# Patient Record
Sex: Female | Born: 1940 | Race: White | Hispanic: No | Marital: Married | Smoking: Never smoker
Health system: Southern US, Community
[De-identification: ages and names within clinical notes are randomized; demographics above are authoritative.]

## PROBLEM LIST (undated history)

## (undated) DIAGNOSIS — I10 Essential (primary) hypertension: Secondary | ICD-10-CM

## (undated) DIAGNOSIS — J45909 Unspecified asthma, uncomplicated: Secondary | ICD-10-CM

## (undated) DIAGNOSIS — E119 Type 2 diabetes mellitus without complications: Secondary | ICD-10-CM

## (undated) DIAGNOSIS — R42 Dizziness and giddiness: Secondary | ICD-10-CM

## (undated) HISTORY — PX: CHOLECYSTECTOMY: SHX55

---

## 1998-06-22 ENCOUNTER — Other Ambulatory Visit: Admission: RE | Admit: 1998-06-22 | Discharge: 1998-06-22 | Payer: Self-pay | Admitting: *Deleted

## 1998-08-13 ENCOUNTER — Other Ambulatory Visit: Admission: RE | Admit: 1998-08-13 | Discharge: 1998-08-13 | Payer: Self-pay | Admitting: *Deleted

## 1999-02-14 ENCOUNTER — Ambulatory Visit (HOSPITAL_COMMUNITY): Admission: RE | Admit: 1999-02-14 | Discharge: 1999-02-14 | Payer: Self-pay | Admitting: Gastroenterology

## 1999-02-14 ENCOUNTER — Encounter (INDEPENDENT_AMBULATORY_CARE_PROVIDER_SITE_OTHER): Payer: Self-pay

## 1999-09-07 ENCOUNTER — Other Ambulatory Visit: Admission: RE | Admit: 1999-09-07 | Discharge: 1999-09-07 | Payer: Self-pay | Admitting: *Deleted

## 2004-06-29 ENCOUNTER — Other Ambulatory Visit: Admission: RE | Admit: 2004-06-29 | Discharge: 2004-06-29 | Payer: Self-pay | Admitting: *Deleted

## 2005-10-23 ENCOUNTER — Other Ambulatory Visit: Admission: RE | Admit: 2005-10-23 | Discharge: 2005-10-23 | Payer: Self-pay | Admitting: *Deleted

## 2006-08-22 ENCOUNTER — Encounter: Admission: RE | Admit: 2006-08-22 | Discharge: 2006-08-22 | Payer: Self-pay | Admitting: *Deleted

## 2006-08-31 ENCOUNTER — Encounter: Admission: RE | Admit: 2006-08-31 | Discharge: 2006-08-31 | Payer: Self-pay | Admitting: General Surgery

## 2006-08-31 ENCOUNTER — Encounter (INDEPENDENT_AMBULATORY_CARE_PROVIDER_SITE_OTHER): Payer: Self-pay | Admitting: *Deleted

## 2006-08-31 ENCOUNTER — Ambulatory Visit (HOSPITAL_BASED_OUTPATIENT_CLINIC_OR_DEPARTMENT_OTHER): Admission: RE | Admit: 2006-08-31 | Discharge: 2006-08-31 | Payer: Self-pay | Admitting: General Surgery

## 2008-01-28 ENCOUNTER — Other Ambulatory Visit: Admission: RE | Admit: 2008-01-28 | Discharge: 2008-01-28 | Payer: Self-pay | Admitting: Gynecology

## 2010-12-02 NOTE — Op Note (Signed)
Emily Bryan, Emily Bryan            ACCOUNT NO.:  [id number]   MEDICAL RECORD NO.:  [id number]          PATIENT TYPE:  AMB   LOCATION:  DSC                          FACILITY:  [facility]   PHYSICIAN:  Rose Phi. Maple Hudson, M.D.   DATE OF BIRTH:  [birth year]   DATE OF PROCEDURE:  08/31/2006  DATE OF DISCHARGE:                               OPERATIVE REPORT   PREOPERATIVE DIAGNOSIS:  Left breast mass.   POSTOPERATIVE DIAGNOSIS:  Left breast mass.   OPERATION:  Excision of left breast mass with needle localization and  specimen mammogram.   SURGEON:  Rose Phi. Maple Hudson, M.D.   ANESTHESIA:  MAC.   OPERATIVE PROCEDURE:  Prior to coming to the operating room, a  localizing wire had been placed in the lesion at about the 5:00-5:30  position of the left breast.   The patient was placed on the operating table, with arms extended on the  arm board.  The left breast was prepped and draped in the usual fashion.  A transverse incision was then outlined over the previously marked area  and incorporating with the wire placement site.  The area was thoroughly  infiltrated with a local anesthetic mixture.  The incision was made the  wire delivered into the incision, and then of the wire and surrounding  tissue were excised.  One could tell that the lesion was excised, and  that was confirmed by specimen mammogram.   Hemostasis was obtained with the cautery.  A subcuticular closure using  a 3-0 Quill suture was carried out.  Dermabond was applied.  A light  dressing was then applied and the patient transferred to the recovery  room in satisfactory condition, having the tolerated procedure well.      Rose Phi. Maple Hudson, M.D.  Electronically Signed     PRY/MEDQ  D:  08/31/2006  T:  08/31/2006  Job:  [id number]

## 2013-05-30 ENCOUNTER — Other Ambulatory Visit (HOSPITAL_COMMUNITY): Payer: Self-pay | Admitting: Internal Medicine

## 2013-05-30 DIAGNOSIS — R079 Chest pain, unspecified: Secondary | ICD-10-CM

## 2013-06-09 ENCOUNTER — Telehealth: Payer: Self-pay | Admitting: *Deleted

## 2013-06-09 ENCOUNTER — Ambulatory Visit (HOSPITAL_COMMUNITY)
Admission: RE | Admit: 2013-06-09 | Discharge: 2013-06-09 | Disposition: A | Discharge status: Home / Self Care | Payer: Medicare Other | Source: Ambulatory Visit | Attending: Internal Medicine | Admitting: Internal Medicine

## 2013-06-09 DIAGNOSIS — R9439 Abnormal result of other cardiovascular function study: Secondary | ICD-10-CM | POA: Insufficient documentation

## 2013-06-09 DIAGNOSIS — I10 Essential (primary) hypertension: Secondary | ICD-10-CM

## 2013-06-09 DIAGNOSIS — R079 Chest pain, unspecified: Secondary | ICD-10-CM

## 2013-06-09 DIAGNOSIS — R0789 Other chest pain: Secondary | ICD-10-CM | POA: Insufficient documentation

## 2013-06-09 NOTE — Telephone Encounter (Signed)
Dr. Patty Sermons spoke with Dr Chilton Si earlier today. Patient had ETT at the hospital and after 3 and 1/2 minutes on Bruce protocol developed shortness of breath. Borderline ST in recovery. History of elevated blood pressure and exertional chest pain.  Dr. Patty Sermons recommended lexiscan myoview. Spoke with patient and went over instructions. Will mail her copy as well. Sent to [facility] for scheduling.

## 2013-06-10 NOTE — Telephone Encounter (Signed)
Patient has been scheduled for 06/30/13

## 2013-06-30 ENCOUNTER — Encounter: Payer: Self-pay | Admitting: Cardiovascular Disease

## 2013-06-30 ENCOUNTER — Ambulatory Visit (HOSPITAL_COMMUNITY): Payer: Medicare Other | Attending: Cardiovascular Disease | Admitting: Radiology

## 2013-06-30 ENCOUNTER — Encounter (INDEPENDENT_AMBULATORY_CARE_PROVIDER_SITE_OTHER): Payer: Self-pay

## 2013-06-30 VITALS — BP 162/85 | Ht 63.5 in | Wt 155.0 lb

## 2013-06-30 DIAGNOSIS — R0602 Shortness of breath: Secondary | ICD-10-CM

## 2013-06-30 DIAGNOSIS — I10 Essential (primary) hypertension: Secondary | ICD-10-CM | POA: Insufficient documentation

## 2013-06-30 DIAGNOSIS — R079 Chest pain, unspecified: Secondary | ICD-10-CM

## 2013-06-30 MED ORDER — TECHNETIUM TC 99M SESTAMIBI GENERIC - CARDIOLITE
33.0000 | Freq: Once | INTRAVENOUS | Status: AC | PRN
  Administered 2013-06-30: 33 via INTRAVENOUS

## 2013-06-30 MED ORDER — TECHNETIUM TC 99M SESTAMIBI GENERIC - CARDIOLITE
11.0000 | Freq: Once | INTRAVENOUS | Status: AC | PRN
  Administered 2013-06-30: 11 via INTRAVENOUS

## 2013-06-30 MED ORDER — REGADENOSON 0.4 MG/5ML IV SOLN
0.4000 mg | Freq: Once | INTRAVENOUS | Status: AC
  Administered 2013-06-30: 0.4 mg via INTRAVENOUS

## 2013-06-30 NOTE — Progress Notes (Signed)
[facility] SITE 3 NUCLEAR MED [address] [place], [place] [address] [phone]    Cardiology Nuclear Med Study  Emily Bryan is a 72 y.o. female     MRN : [id number]     DOB: 10/30/1940  Procedure Date: 06/30/2013  Nuclear Med Background Indication for Stress Test:  Evaluation for Ischemia and Abnormal GXT on 06/09/13 History:  No Known CAD, Asthma Cardiac Risk Factors: Family History - CAD and Hypertension  Symptoms:  Chest Pain, DOE and SOB   Nuclear Pre-Procedure Caffeine/Decaff Intake:  None > 12 hours NPO After: 9:00pm   Lungs:  clear O2 Sat: 98% on room air. IV 0.9% NS with Angio Cath:  22g  IV Site: R Antecubital  IV Started by:  Dario Guardian, CNMT  Chest Size (in):  36 Cup Size: C  Height: 5' 3.5" (1.613 m)  Weight:  155 lb (70.308 kg)  BMI:  Body mass index is 27.02 kg/(m^2). Tech Comments:  n/a    Nuclear Med Study 1 or 2 day study: 1 day  Stress Test Type:  Lexiscan  Reading MD: Willa Rough, MD  Order Authorizing Provider:  Villa Herb  Resting Radionuclide: Technetium 9m Sestamibi  Resting Radionuclide Dose: 11.0 mCi   Stress Radionuclide:  Technetium 82m Sestamibi  Stress Radionuclide Dose: 33.0 mCi           Stress Protocol Rest HR: 50 Stress HR: 84  Rest BP: 162/85 Stress BP: 188/82  Exercise Time (min): n/a METS: n/a   Predicted Max HR: 147 bpm % Max HR: 57.14 bpm Rate Pressure Product: 21308   Dose of Adenosine (mg):  n/a Dose of Lexiscan: 0.4 mg  Dose of Atropine (mg): n/a Dose of Dobutamine: n/a mcg/kg/min (at max HR)  Stress Test Technologist: Milana Na, EMT-P  Nuclear Technologist:  Domenic Polite, CNMT     Rest Procedure:  Myocardial perfusion imaging was performed at rest 45 minutes following the intravenous administration of Technetium 65m Sestamibi. Rest ECG: NSR - Normal EKG  Stress Procedure:  The patient received IV Lexiscan 0.4 mg over 15-seconds.  Technetium 70m Sestamibi injected at  30-seconds. This patient had sob, lt. Headed, and cough with the Lexiscan injection. Quantitative spect images were obtained after a 45 minute delay. Stress ECG: No significant change from baseline ECG  QPS Raw Data Images:  Normal; no motion artifact; normal heart/lung ratio. Stress Images:  Normal homogeneous uptake in all areas of the myocardium. Rest Images:  Normal homogeneous uptake in all areas of the myocardium. Subtraction (SDS):  No evidence of ischemia. Transient Ischemic Dilatation (Normal <1.22):  1.00 Lung/Heart Ratio (Normal <0.45):  0.30  Quantitative Gated Spect Images QGS EDV:  55 ml QGS ESV:  12 ml  Impression Exercise Capacity:  Lexiscan with no exercise. BP Response:  Normal blood pressure response. Clinical Symptoms:  No significant symptoms noted. ECG Impression:  No significant ST segment change suggestive of ischemia. Comparison with Prior Nuclear Study: No previous nuclear study performed  Overall Impression:  Normal stress nuclear study.  LV Ejection Fraction: 78%.  LV Wall Motion:  NL LV Function; NL Wall Motion.   Vesta Mixer, Montez Hageman., MD, [facility] 06/30/2013, 4:38 PM Office - [phone] Pager [phone]

## 2013-07-02 ENCOUNTER — Telehealth: Payer: Self-pay | Admitting: *Deleted

## 2013-07-02 NOTE — Telephone Encounter (Signed)
Message copied by Burnell Blanks on Wed Jul 02, 2013  9:35 AM ------      Message from: Cassell Clement      Created: Tue Jul 01, 2013  6:44 PM       Please report to patient and to Dr. Elmore Guise.  The lexiscan myoview was normal. No ischemia. Normal EF 78%. ------

## 2013-07-02 NOTE — Telephone Encounter (Signed)
Advised patient

## 2014-08-24 DIAGNOSIS — R799 Abnormal finding of blood chemistry, unspecified: Secondary | ICD-10-CM | POA: Diagnosis not present

## 2014-08-24 DIAGNOSIS — E118 Type 2 diabetes mellitus with unspecified complications: Secondary | ICD-10-CM | POA: Diagnosis not present

## 2014-08-24 DIAGNOSIS — I10 Essential (primary) hypertension: Secondary | ICD-10-CM | POA: Diagnosis not present

## 2014-10-19 DIAGNOSIS — E118 Type 2 diabetes mellitus with unspecified complications: Secondary | ICD-10-CM | POA: Diagnosis not present

## 2014-10-19 DIAGNOSIS — I10 Essential (primary) hypertension: Secondary | ICD-10-CM | POA: Diagnosis not present

## 2014-11-02 DIAGNOSIS — I10 Essential (primary) hypertension: Secondary | ICD-10-CM | POA: Diagnosis not present

## 2014-11-02 DIAGNOSIS — J45909 Unspecified asthma, uncomplicated: Secondary | ICD-10-CM | POA: Diagnosis not present

## 2015-01-04 DIAGNOSIS — I10 Essential (primary) hypertension: Secondary | ICD-10-CM | POA: Diagnosis not present

## 2015-01-04 DIAGNOSIS — E118 Type 2 diabetes mellitus with unspecified complications: Secondary | ICD-10-CM | POA: Diagnosis not present

## 2015-01-04 DIAGNOSIS — E119 Type 2 diabetes mellitus without complications: Secondary | ICD-10-CM | POA: Diagnosis not present

## 2015-01-25 DIAGNOSIS — J45909 Unspecified asthma, uncomplicated: Secondary | ICD-10-CM | POA: Diagnosis not present

## 2015-03-29 DIAGNOSIS — J45909 Unspecified asthma, uncomplicated: Secondary | ICD-10-CM | POA: Diagnosis not present

## 2015-03-29 DIAGNOSIS — Z23 Encounter for immunization: Secondary | ICD-10-CM | POA: Diagnosis not present

## 2015-05-26 DIAGNOSIS — D559 Anemia due to enzyme disorder, unspecified: Secondary | ICD-10-CM | POA: Diagnosis not present

## 2015-05-26 DIAGNOSIS — I6523 Occlusion and stenosis of bilateral carotid arteries: Secondary | ICD-10-CM | POA: Diagnosis not present

## 2015-05-26 DIAGNOSIS — E119 Type 2 diabetes mellitus without complications: Secondary | ICD-10-CM | POA: Diagnosis not present

## 2015-05-26 DIAGNOSIS — I6521 Occlusion and stenosis of right carotid artery: Secondary | ICD-10-CM | POA: Diagnosis not present

## 2015-05-26 DIAGNOSIS — I10 Essential (primary) hypertension: Secondary | ICD-10-CM | POA: Diagnosis not present

## 2015-05-26 DIAGNOSIS — Z0000 Encounter for general adult medical examination without abnormal findings: Secondary | ICD-10-CM | POA: Diagnosis not present

## 2015-05-26 DIAGNOSIS — J45909 Unspecified asthma, uncomplicated: Secondary | ICD-10-CM | POA: Diagnosis not present

## 2015-05-26 DIAGNOSIS — E109 Type 1 diabetes mellitus without complications: Secondary | ICD-10-CM | POA: Diagnosis not present

## 2015-06-24 DIAGNOSIS — Z1231 Encounter for screening mammogram for malignant neoplasm of breast: Secondary | ICD-10-CM | POA: Diagnosis not present

## 2015-07-17 DIAGNOSIS — J441 Chronic obstructive pulmonary disease with (acute) exacerbation: Secondary | ICD-10-CM | POA: Diagnosis not present

## 2015-07-17 DIAGNOSIS — J019 Acute sinusitis, unspecified: Secondary | ICD-10-CM | POA: Diagnosis not present

## 2015-08-30 DIAGNOSIS — J45998 Other asthma: Secondary | ICD-10-CM | POA: Diagnosis not present

## 2015-08-30 DIAGNOSIS — I10 Essential (primary) hypertension: Secondary | ICD-10-CM | POA: Diagnosis not present

## 2015-08-30 DIAGNOSIS — E1165 Type 2 diabetes mellitus with hyperglycemia: Secondary | ICD-10-CM | POA: Diagnosis not present

## 2015-09-06 ENCOUNTER — Other Ambulatory Visit: Payer: Self-pay | Admitting: Internal Medicine

## 2015-09-06 ENCOUNTER — Ambulatory Visit
Admission: RE | Admit: 2015-09-06 | Discharge: 2015-09-06 | Disposition: A | Discharge status: Home / Self Care | Payer: Medicare Other | Source: Ambulatory Visit | Attending: Internal Medicine | Admitting: Internal Medicine

## 2015-09-06 DIAGNOSIS — R05 Cough: Secondary | ICD-10-CM

## 2015-09-06 DIAGNOSIS — J45998 Other asthma: Secondary | ICD-10-CM | POA: Diagnosis not present

## 2015-09-06 DIAGNOSIS — J45909 Unspecified asthma, uncomplicated: Secondary | ICD-10-CM

## 2015-09-06 DIAGNOSIS — R059 Cough, unspecified: Secondary | ICD-10-CM

## 2015-09-27 DIAGNOSIS — J45998 Other asthma: Secondary | ICD-10-CM | POA: Diagnosis not present

## 2015-10-07 DIAGNOSIS — H2513 Age-related nuclear cataract, bilateral: Secondary | ICD-10-CM | POA: Diagnosis not present

## 2015-10-07 DIAGNOSIS — E119 Type 2 diabetes mellitus without complications: Secondary | ICD-10-CM | POA: Diagnosis not present

## 2015-10-09 DIAGNOSIS — R739 Hyperglycemia, unspecified: Secondary | ICD-10-CM | POA: Diagnosis not present

## 2015-10-09 DIAGNOSIS — R51 Headache: Secondary | ICD-10-CM | POA: Diagnosis not present

## 2015-10-09 DIAGNOSIS — Z7984 Long term (current) use of oral hypoglycemic drugs: Secondary | ICD-10-CM | POA: Diagnosis not present

## 2015-10-09 DIAGNOSIS — R350 Frequency of micturition: Secondary | ICD-10-CM | POA: Diagnosis not present

## 2015-10-09 DIAGNOSIS — E1165 Type 2 diabetes mellitus with hyperglycemia: Secondary | ICD-10-CM | POA: Diagnosis not present

## 2015-10-09 DIAGNOSIS — T380X5A Adverse effect of glucocorticoids and synthetic analogues, initial encounter: Secondary | ICD-10-CM | POA: Diagnosis not present

## 2015-10-09 DIAGNOSIS — R609 Edema, unspecified: Secondary | ICD-10-CM | POA: Diagnosis not present

## 2015-10-09 DIAGNOSIS — R0981 Nasal congestion: Secondary | ICD-10-CM | POA: Diagnosis not present

## 2015-10-09 DIAGNOSIS — R7989 Other specified abnormal findings of blood chemistry: Secondary | ICD-10-CM | POA: Diagnosis not present

## 2015-10-09 DIAGNOSIS — T887XXA Unspecified adverse effect of drug or medicament, initial encounter: Secondary | ICD-10-CM | POA: Diagnosis not present

## 2015-10-09 DIAGNOSIS — R748 Abnormal levels of other serum enzymes: Secondary | ICD-10-CM | POA: Diagnosis not present

## 2015-10-09 DIAGNOSIS — J45909 Unspecified asthma, uncomplicated: Secondary | ICD-10-CM | POA: Diagnosis not present

## 2015-10-18 DIAGNOSIS — E1165 Type 2 diabetes mellitus with hyperglycemia: Secondary | ICD-10-CM | POA: Diagnosis not present

## 2015-10-18 DIAGNOSIS — J45998 Other asthma: Secondary | ICD-10-CM | POA: Diagnosis not present

## 2015-11-01 DIAGNOSIS — E1165 Type 2 diabetes mellitus with hyperglycemia: Secondary | ICD-10-CM | POA: Diagnosis not present

## 2015-11-01 DIAGNOSIS — I119 Hypertensive heart disease without heart failure: Secondary | ICD-10-CM | POA: Diagnosis not present

## 2015-11-01 DIAGNOSIS — T814XXA Infection following a procedure, initial encounter: Secondary | ICD-10-CM | POA: Diagnosis not present

## 2015-11-01 DIAGNOSIS — L03317 Cellulitis of buttock: Secondary | ICD-10-CM | POA: Diagnosis not present

## 2015-11-04 DIAGNOSIS — B9562 Methicillin resistant Staphylococcus aureus infection as the cause of diseases classified elsewhere: Secondary | ICD-10-CM | POA: Diagnosis not present

## 2015-11-12 DIAGNOSIS — L0291 Cutaneous abscess, unspecified: Secondary | ICD-10-CM | POA: Diagnosis not present

## 2015-12-06 DIAGNOSIS — R609 Edema, unspecified: Secondary | ICD-10-CM | POA: Diagnosis not present

## 2015-12-06 DIAGNOSIS — A4902 Methicillin resistant Staphylococcus aureus infection, unspecified site: Secondary | ICD-10-CM | POA: Diagnosis not present

## 2015-12-06 DIAGNOSIS — E1165 Type 2 diabetes mellitus with hyperglycemia: Secondary | ICD-10-CM | POA: Diagnosis not present

## 2015-12-16 DIAGNOSIS — M7989 Other specified soft tissue disorders: Secondary | ICD-10-CM | POA: Diagnosis not present

## 2016-01-03 DIAGNOSIS — E1165 Type 2 diabetes mellitus with hyperglycemia: Secondary | ICD-10-CM | POA: Diagnosis not present

## 2016-01-03 DIAGNOSIS — E109 Type 1 diabetes mellitus without complications: Secondary | ICD-10-CM | POA: Diagnosis not present

## 2016-01-03 DIAGNOSIS — I119 Hypertensive heart disease without heart failure: Secondary | ICD-10-CM | POA: Diagnosis not present

## 2016-02-15 DIAGNOSIS — E1165 Type 2 diabetes mellitus with hyperglycemia: Secondary | ICD-10-CM | POA: Diagnosis not present

## 2016-02-15 DIAGNOSIS — J45909 Unspecified asthma, uncomplicated: Secondary | ICD-10-CM | POA: Diagnosis not present

## 2016-03-09 DIAGNOSIS — E1165 Type 2 diabetes mellitus with hyperglycemia: Secondary | ICD-10-CM | POA: Diagnosis not present

## 2016-03-09 DIAGNOSIS — J45909 Unspecified asthma, uncomplicated: Secondary | ICD-10-CM | POA: Diagnosis not present

## 2016-03-09 DIAGNOSIS — I10 Essential (primary) hypertension: Secondary | ICD-10-CM | POA: Diagnosis not present

## 2016-03-14 DIAGNOSIS — J45909 Unspecified asthma, uncomplicated: Secondary | ICD-10-CM | POA: Diagnosis not present

## 2016-03-28 DIAGNOSIS — Z23 Encounter for immunization: Secondary | ICD-10-CM | POA: Diagnosis not present

## 2016-03-28 DIAGNOSIS — J45909 Unspecified asthma, uncomplicated: Secondary | ICD-10-CM | POA: Diagnosis not present

## 2016-03-31 DIAGNOSIS — R6889 Other general symptoms and signs: Secondary | ICD-10-CM | POA: Diagnosis not present

## 2016-04-14 DIAGNOSIS — J45901 Unspecified asthma with (acute) exacerbation: Secondary | ICD-10-CM | POA: Diagnosis not present

## 2016-05-04 DIAGNOSIS — H2513 Age-related nuclear cataract, bilateral: Secondary | ICD-10-CM | POA: Diagnosis not present

## 2016-05-04 DIAGNOSIS — H538 Other visual disturbances: Secondary | ICD-10-CM | POA: Diagnosis not present

## 2016-05-14 DIAGNOSIS — J45901 Unspecified asthma with (acute) exacerbation: Secondary | ICD-10-CM | POA: Diagnosis not present

## 2016-05-23 DIAGNOSIS — J324 Chronic pansinusitis: Secondary | ICD-10-CM | POA: Diagnosis not present

## 2016-05-23 DIAGNOSIS — K219 Gastro-esophageal reflux disease without esophagitis: Secondary | ICD-10-CM | POA: Diagnosis not present

## 2016-05-23 DIAGNOSIS — J45909 Unspecified asthma, uncomplicated: Secondary | ICD-10-CM | POA: Diagnosis not present

## 2016-05-23 DIAGNOSIS — J310 Chronic rhinitis: Secondary | ICD-10-CM | POA: Diagnosis not present

## 2016-06-14 DIAGNOSIS — J45901 Unspecified asthma with (acute) exacerbation: Secondary | ICD-10-CM | POA: Diagnosis not present

## 2016-06-23 DIAGNOSIS — J324 Chronic pansinusitis: Secondary | ICD-10-CM | POA: Diagnosis not present

## 2016-06-23 DIAGNOSIS — J45909 Unspecified asthma, uncomplicated: Secondary | ICD-10-CM | POA: Diagnosis not present

## 2016-06-23 DIAGNOSIS — J0100 Acute maxillary sinusitis, unspecified: Secondary | ICD-10-CM | POA: Diagnosis not present

## 2016-06-23 DIAGNOSIS — J310 Chronic rhinitis: Secondary | ICD-10-CM | POA: Diagnosis not present

## 2016-07-14 DIAGNOSIS — J45901 Unspecified asthma with (acute) exacerbation: Secondary | ICD-10-CM | POA: Diagnosis not present

## 2017-07-25 IMAGING — CR DG CHEST 2V
2 series · 2 of 2 positions shown · non-contrast
Comparison: PA and lateral chest x-ray March 28, 2014

CLINICAL DATA: Two years of intermittent cough with low-grade fever
and weight loss

EXAM:
CHEST  2 VIEW

[w chest pa]
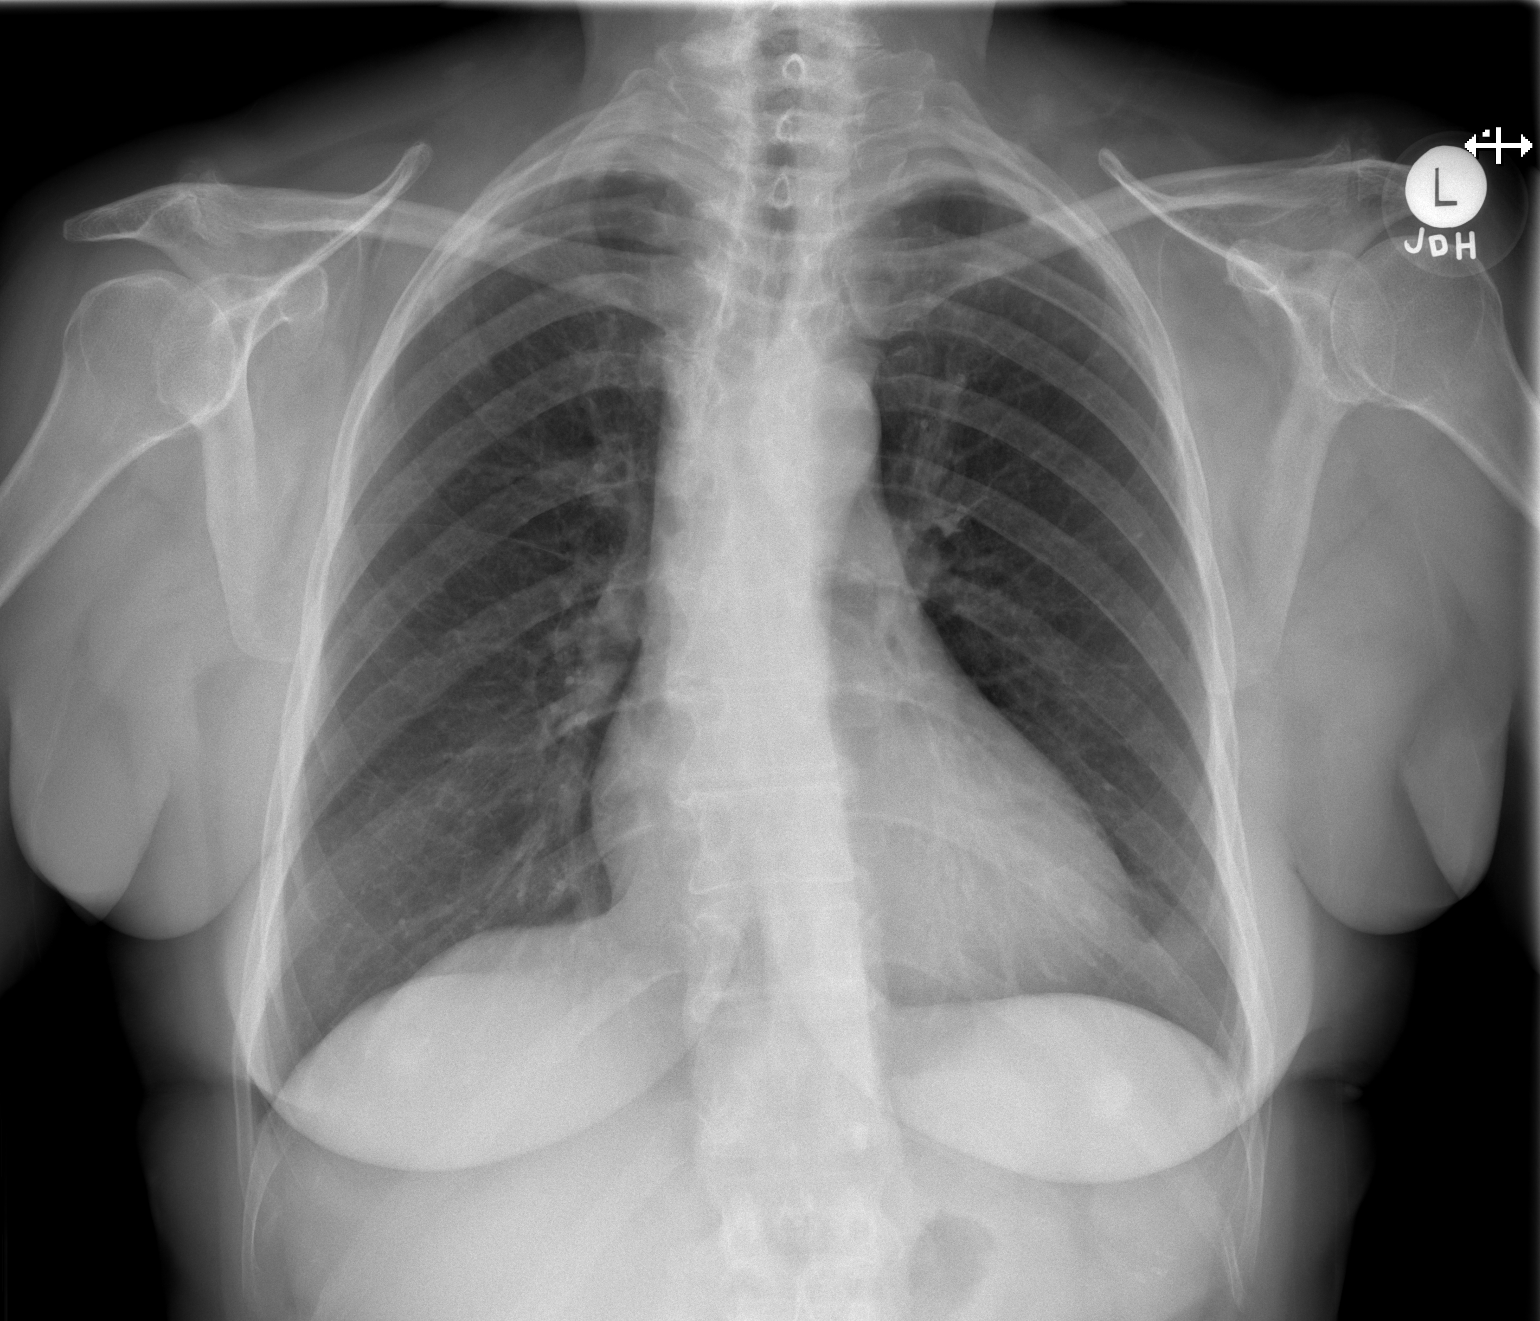

[w chest lat]
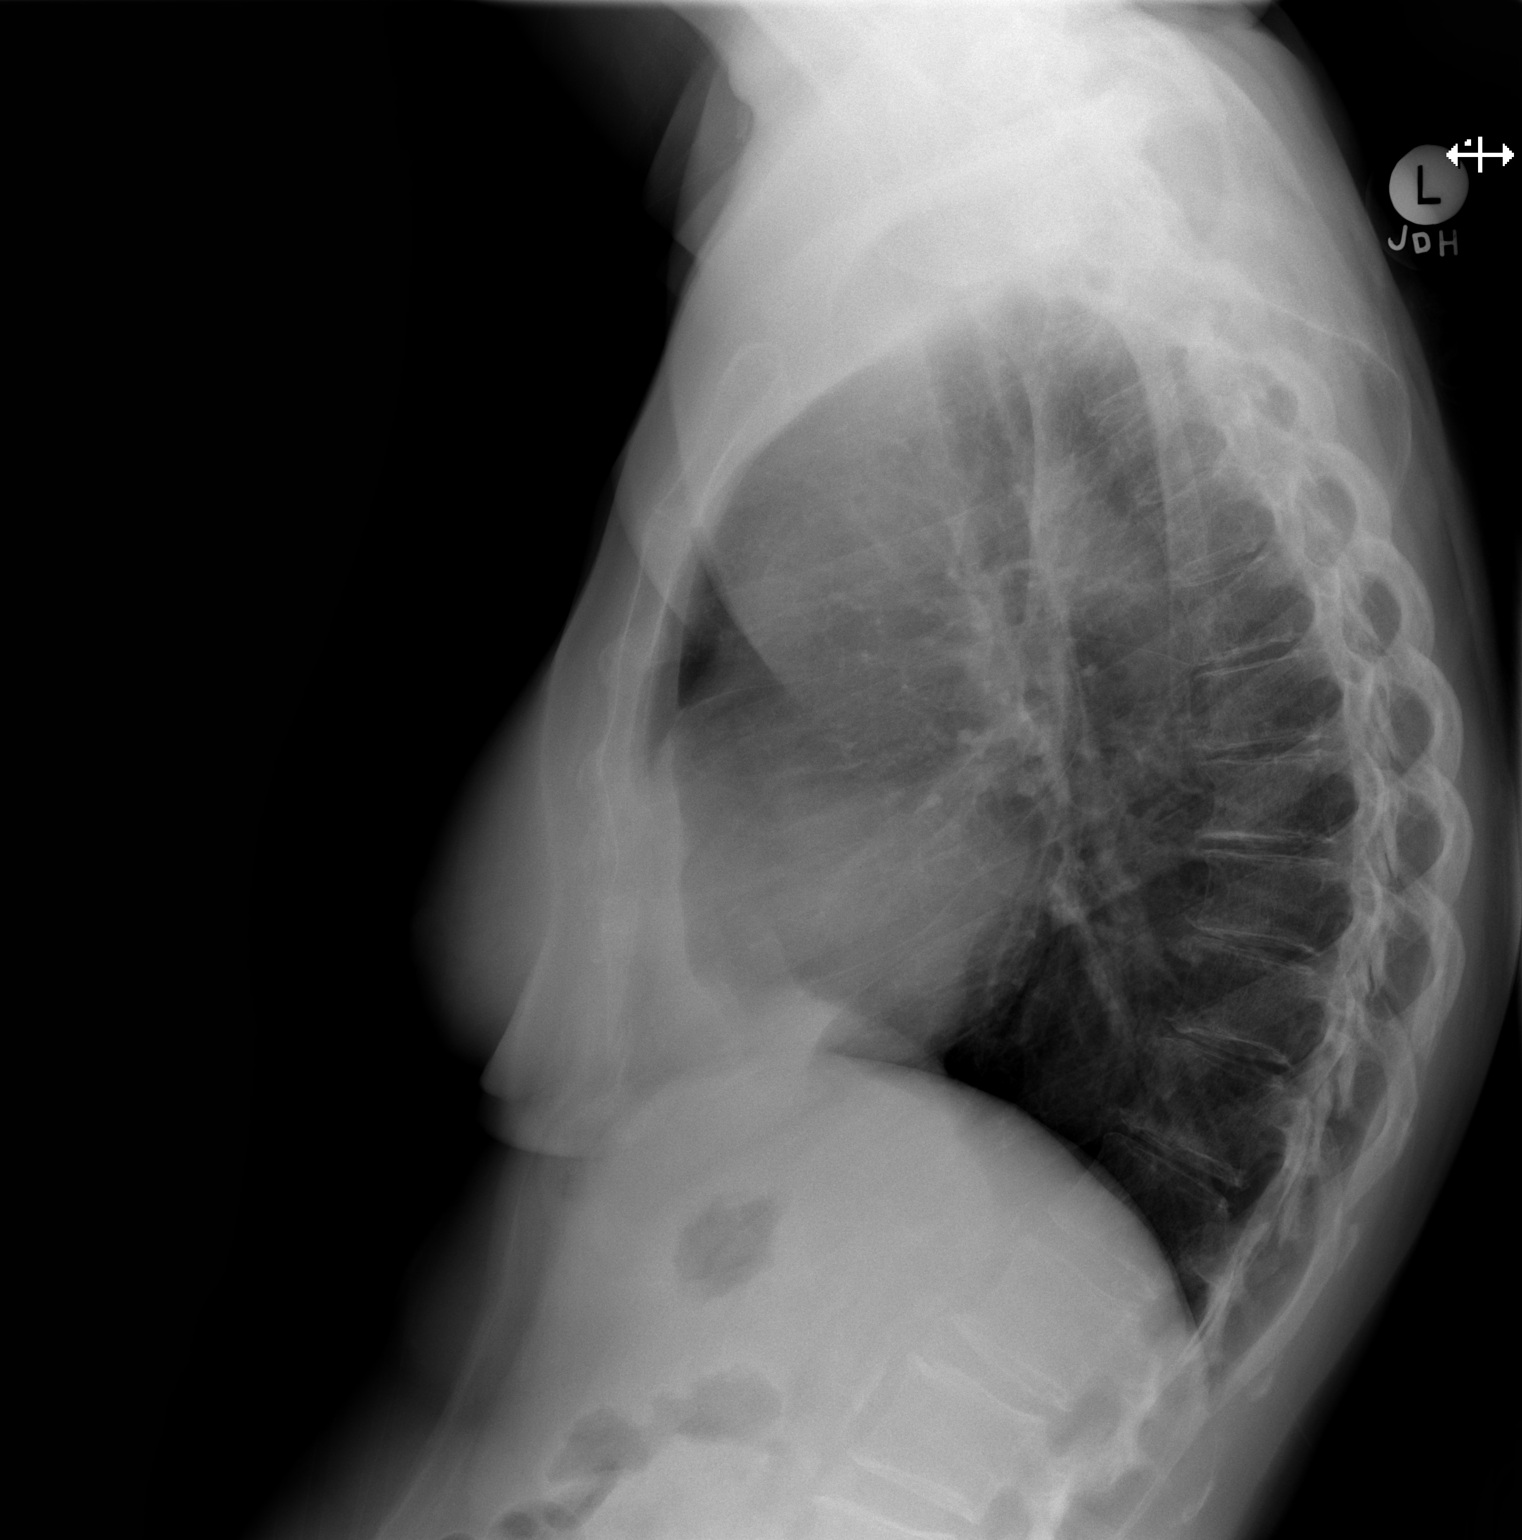

[2 of 2 positions shown; findings below may reference images not displayed]

FINDINGS: The lungs are well-expanded and clear. There is stable apical
pleural thickening bilaterally. The heart and pulmonary vascularity
are normal. The mediastinum is normal in width. There is stable
dextrocurvature centered in the mid thoracic spine.
IMPRESSION: There is no active cardiopulmonary disease.

## 2017-11-14 ENCOUNTER — Inpatient Hospital Stay (HOSPITAL_COMMUNITY)
Admission: AD | Admit: 2017-11-14 | Discharge: 2017-11-17 | DRG: 482 | Disposition: A | Discharge status: Home Health | Payer: Medicare Other | Source: Other Acute Inpatient Hospital | Attending: Internal Medicine | Admitting: Internal Medicine

## 2017-11-14 DIAGNOSIS — Z981 Arthrodesis status: Secondary | ICD-10-CM | POA: Diagnosis not present

## 2017-11-14 DIAGNOSIS — J45909 Unspecified asthma, uncomplicated: Secondary | ICD-10-CM | POA: Diagnosis present

## 2017-11-14 DIAGNOSIS — Z7984 Long term (current) use of oral hypoglycemic drugs: Secondary | ICD-10-CM | POA: Diagnosis not present

## 2017-11-14 DIAGNOSIS — R42 Dizziness and giddiness: Secondary | ICD-10-CM | POA: Diagnosis present

## 2017-11-14 DIAGNOSIS — W19XXXA Unspecified fall, initial encounter: Secondary | ICD-10-CM | POA: Diagnosis present

## 2017-11-14 DIAGNOSIS — Z7952 Long term (current) use of systemic steroids: Secondary | ICD-10-CM | POA: Diagnosis not present

## 2017-11-14 DIAGNOSIS — M810 Age-related osteoporosis without current pathological fracture: Secondary | ICD-10-CM | POA: Diagnosis present

## 2017-11-14 DIAGNOSIS — J4520 Mild intermittent asthma, uncomplicated: Secondary | ICD-10-CM | POA: Diagnosis not present

## 2017-11-14 DIAGNOSIS — R260 Ataxic gait: Secondary | ICD-10-CM | POA: Diagnosis present

## 2017-11-14 DIAGNOSIS — Y92000 Kitchen of unspecified non-institutional (private) residence as  the place of occurrence of the external cause: Secondary | ICD-10-CM | POA: Diagnosis not present

## 2017-11-14 DIAGNOSIS — J329 Chronic sinusitis, unspecified: Secondary | ICD-10-CM | POA: Diagnosis present

## 2017-11-14 DIAGNOSIS — S72001A Fracture of unspecified part of neck of right femur, initial encounter for closed fracture: Secondary | ICD-10-CM | POA: Diagnosis not present

## 2017-11-14 DIAGNOSIS — W010XXA Fall on same level from slipping, tripping and stumbling without subsequent striking against object, initial encounter: Secondary | ICD-10-CM | POA: Diagnosis present

## 2017-11-14 DIAGNOSIS — F410 Panic disorder [episodic paroxysmal anxiety] without agoraphobia: Secondary | ICD-10-CM | POA: Diagnosis present

## 2017-11-14 DIAGNOSIS — R52 Pain, unspecified: Secondary | ICD-10-CM

## 2017-11-14 DIAGNOSIS — R011 Cardiac murmur, unspecified: Secondary | ICD-10-CM | POA: Diagnosis present

## 2017-11-14 DIAGNOSIS — R296 Repeated falls: Secondary | ICD-10-CM | POA: Diagnosis present

## 2017-11-14 DIAGNOSIS — Z419 Encounter for procedure for purposes other than remedying health state, unspecified: Secondary | ICD-10-CM

## 2017-11-14 DIAGNOSIS — S72011A Unspecified intracapsular fracture of right femur, initial encounter for closed fracture: Secondary | ICD-10-CM | POA: Diagnosis present

## 2017-11-14 DIAGNOSIS — I361 Nonrheumatic tricuspid (valve) insufficiency: Secondary | ICD-10-CM | POA: Diagnosis not present

## 2017-11-14 DIAGNOSIS — Z8249 Family history of ischemic heart disease and other diseases of the circulatory system: Secondary | ICD-10-CM | POA: Diagnosis not present

## 2017-11-14 DIAGNOSIS — E119 Type 2 diabetes mellitus without complications: Secondary | ICD-10-CM

## 2017-11-14 DIAGNOSIS — I10 Essential (primary) hypertension: Secondary | ICD-10-CM | POA: Diagnosis present

## 2017-11-14 HISTORY — DX: Essential (primary) hypertension: I10

## 2017-11-14 HISTORY — DX: Type 2 diabetes mellitus without complications: E11.9

## 2017-11-14 HISTORY — DX: Unspecified asthma, uncomplicated: J45.909

## 2017-11-14 HISTORY — DX: Dizziness and giddiness: R42

## 2017-11-14 LAB — GLUCOSE, CAPILLARY: GLUCOSE-CAPILLARY: 108 mg/dL — AB (ref 65–99)

## 2017-11-14 MED ORDER — ZOLPIDEM TARTRATE 5 MG PO TABS: 5.0000 mg | ORAL_TABLET | Freq: Every evening | ORAL | Status: DC | PRN

## 2017-11-14 MED ORDER — HYDRALAZINE HCL 20 MG/ML IJ SOLN: 5.0000 mg | INTRAMUSCULAR | Status: DC | PRN

## 2017-11-14 MED ORDER — MORPHINE SULFATE (PF) 2 MG/ML IV SOLN: 2.0000 mg | INTRAVENOUS | Status: DC | PRN

## 2017-11-14 MED ORDER — ONDANSETRON HCL 4 MG/2ML IJ SOLN: 4.0000 mg | Freq: Three times a day (TID) | INTRAMUSCULAR | Status: DC | PRN

## 2017-11-14 MED ORDER — METHOCARBAMOL 500 MG PO TABS
500.0000 mg | ORAL_TABLET | Freq: Three times a day (TID) | ORAL | Status: DC | PRN
  Administered 2017-11-16 (×2): 500 mg via ORAL
  Filled 2017-11-14 (×2): qty 1

## 2017-11-14 MED ORDER — POLYETHYLENE GLYCOL 3350 17 G PO PACK: 17.0000 g | PACK | Freq: Every day | ORAL | Status: DC | PRN

## 2017-11-14 MED ORDER — OXYCODONE-ACETAMINOPHEN 5-325 MG PO TABS
1.0000 | ORAL_TABLET | ORAL | Status: DC | PRN
Start: 2017-11-14 — End: 2017-11-17
  Administered 2017-11-16 – 2017-11-17 (×2): 1 via ORAL
  Filled 2017-11-14 (×2): qty 1

## 2017-11-14 MED ORDER — ACETAMINOPHEN 325 MG PO TABS: 650.0000 mg | ORAL_TABLET | Freq: Four times a day (QID) | ORAL | Status: DC | PRN

## 2017-11-14 NOTE — H&P (Signed)
History and Physical    Emily Bryan ZOX:[id number] DOB: 1941/02/20 DOA: 11/14/2017  Referring MD/NP/PA:   PCP: Nila Nephew, MD   Patient coming from:  The patient is coming from home.  At baseline, pt is independent for most of ADL.     Chief Complaint: Right hip pain after fall  HPI: Emily Bryan is a 77 y.o. female with medical history significant of hypertension, diabetes mellitus, asthma, panic attacks, vertigo, chronic neck pain, who presents with right hip pain after fall.  Patient states that she slipped and fell at home after using bathroom yesterday. She developed pain in the left hip, which is severe, sharp, nonradiating.  No leg numbness.  No loss of consciousness.  Patient states that she injured her right face, not neck injury.  At baseline she has ataxic gait instability. She reports mild dizziness, but no unilateral weakness, slurred speech or facial droop.  Patient denies chest pain, shortness breath, nausea, vomiting, diarrhea, abdominal pain, symptoms of UTI.  She is wearing the c-collar since she had C-spine surgery last year.  Patient was initially seen in [facility], and was found to have right hip nondisplaced subcapital femoral neck fracture. Therefore transferred to [facility] hospital. Dr. Eulah Pont was consulted.  ED Course: pt was found to have WBC 8.8, electrolytes renal function okay, negative urinalysis, temperature 99.2, no tachycardia, no tachypnea, oxygen saturation 100% on room air, negative chest x-ray. CT-head done in [facility] hospital her is negative for acute intracranial abnormalities. Patient is admitted to MedSurg bed as inpatient.  Review of Systems:   General: no fevers, chills, no body weight gain, has fatigue HEENT: no blurry vision, hearing changes or sore throat Respiratory: no dyspnea, coughing, wheezing CV: no chest pain, no palpitations GI: no nausea, vomiting, abdominal pain, diarrhea, constipation GU: no dysuria, burning on  urination, increased urinary frequency, hematuria  Ext: no leg edema Neuro: no unilateral weakness, numbness, or tingling, no vision change or hearing loss. Had fall Skin: no rash, no skin tear. MSK: has right hip pain. Heme: No easy bruising.  Travel history: No recent long distant travel.  Allergy:  Allergies  Allergen Reactions  . Erythromycin   . Sulfa Antibiotics     Past Medical History:  Diagnosis Date  . Asthma   . Diabetes mellitus without complication (HCC)   . Essential hypertension   . Vertigo     Past Surgical History:  Procedure Laterality Date  . CHOLECYSTECTOMY      Social History:  reports that she has never smoked. She has never used smokeless tobacco. She reports that she does not drink alcohol or use drugs.  Family History:  Family History  Problem Relation Age of Onset  . Hypertension Brother      Prior to Admission medications   Medication Sig Start Date End Date Taking? Authorizing Provider  diazepam (VALIUM) 5 MG tablet Take 5 mg by mouth 3 (three) times daily as needed for dizziness. 11/05/17   [provider]  HYDROcodone-acetaminophen (NORCO/VICODIN) 5-325 MG tablet Take 1-2 tablets by mouth every 6 (six) hours as needed for pain. 10/24/17   [provider]  meclizine (ANTIVERT) 25 MG tablet Take 25 mg by mouth 3 (three) times daily as needed for dizziness. 10/22/17   [provider]  montelukast (SINGULAIR) 10 MG tablet Take 10 mg by mouth daily. 11/05/17   [provider]  predniSONE (DELTASONE) 5 MG tablet Take 5 mg by mouth daily. 10/17/17   [provider]  triazolam (HALCION) 0.25 MG tablet Take 0.25 mg by mouth at bedtime. 11/13/17   [provider]    Physical Exam: Vitals:   11/14/17 2149 11/15/17 0349  BP: (!) 153/55 (!) 147/56  Pulse: 75 80  Resp: 17 16  Temp: 99.2 F (37.3 C) 99.7 F (37.6 C)  TempSrc: Oral Oral  SpO2: 100% 97%   General: Not in acute distress HEENT: has  mild skin abrasion to right face.       Eyes: PERRL, EOMI, no scleral icterus.       ENT: No discharge from the ears and nose, no pharynx injection, no tonsillar enlargement.        Neck: No JVD, no bruit, no mass felt. Heme: No neck lymph node enlargement. Cardiac: S1/S2, RRR, No murmurs, No gallops or rubs. Respiratory: Good air movement bilaterally. No rales, wheezing, rhonchi or rubs. GI: Soft, nondistended, nontender, no rebound pain, no organomegaly, BS present. GU: No hematuria Ext: No pitting leg edema bilaterally. 2+DP/PT pulse bilaterally. Musculoskeletal: has right hip tenderness. Skin: No rashes.  Neuro: Alert, oriented X3, cranial nerves II-XII grossly intact, moves all extremities. Psych: Patient is not psychotic, no suicidal or hemocidal ideation.  Labs on Admission: I have personally reviewed following labs and imaging studies  CBC: Recent Labs  Lab 11/15/17 0040  WBC 10.6*  HGB 12.1  HCT 38.1  MCV 90.3  PLT 174   Basic Metabolic Panel: Recent Labs  Lab 11/15/17 0040  NA 141  K 3.8  CL 105  CO2 28  GLUCOSE 119*  BUN 11  CREATININE 0.90  CALCIUM 8.8*   GFR: CrCl cannot be calculated (Unknown ideal weight.). Liver Function Tests: No results for input(s): AST, ALT, ALKPHOS, BILITOT, PROT, ALBUMIN in the last 168 hours. No results for input(s): LIPASE, AMYLASE in the last 168 hours. No results for input(s): AMMONIA in the last 168 hours. Coagulation Profile: Recent Labs  Lab 11/14/17 2324  INR 1.14   Cardiac Enzymes: No results for input(s): CKTOTAL, CKMB, CKMBINDEX, TROPONINI in the last 168 hours. BNP (last 3 results) No results for input(s): PROBNP in the last 8760 hours. HbA1C: Recent Labs    11/15/17 0335  HGBA1C 6.7*   CBG: Recent Labs  Lab 11/14/17 2340  GLUCAP 108*   Lipid Profile: No results for input(s): CHOL, HDL, LDLCALC, TRIG, CHOLHDL, LDLDIRECT in the last 72 hours. Thyroid Function Tests: No results for input(s): TSH,  T4TOTAL, FREET4, T3FREE, THYROIDAB in the last 72 hours. Anemia Panel: No results for input(s): VITAMINB12, FOLATE, FERRITIN, TIBC, IRON, RETICCTPCT in the last 72 hours. Urine analysis: No results found for: COLORURINE, APPEARANCEUR, LABSPEC, PHURINE, GLUCOSEU, HGBUR, BILIRUBINUR, KETONESUR, PROTEINUR, UROBILINOGEN, NITRITE, LEUKOCYTESUR Sepsis Labs: (procalcitonin:4,lacticidven:4) )No results found for this or any previous visit (from the past 240 hour(s)).   Radiological Exams on Admission: Ct Head Wo Contrast  Result Date: 11/15/2017 CLINICAL DATA:  Head trauma and ataxia EXAM: CT HEAD WITHOUT CONTRAST TECHNIQUE: Contiguous axial images were obtained from the base of the skull through the vertex without intravenous contrast. COMPARISON:  Sinus CT 06/23/2016 FINDINGS: Brain: No mass lesion, intraparenchymal hemorrhage or extra-axial collection. No evidence of acute cortical infarct. Normal appearance of the brain parenchyma and extra axial spaces for age. Vascular: No hyperdense vessel or unexpected vascular calcification. Skull: Normal visualized skull base, calvarium and extracranial soft tissues. Sinuses/Orbits: Complete opacification of the left frontal, left anterior ethmoid and left maxillary sinuses. Normal orbits. IMPRESSION: 1. Normal aging brain. 2. Left ostiomeatal complex pattern  chronic sinusitis. Electronically Signed   By: Deatra Robinson M.D.   On: 11/15/2017 05:23     EKG: Independently reviewed.  Sinus rhythm, LAD, low voltage, poor R wave progression, anteroseptal infarction pattern.  Assessment/Plan Principal Problem:   Closed right hip fracture [facility]) Active Problems:   Essential hypertension   Diabetes mellitus without complication (HCC)   Asthma   Vertigo   Fall   Closed right hip fracture (HCC): As evidenced by x-ray. Patient has moderate pain now. No neurovascular compromise. Orthopedic surgeon was consulted. RN will inform Dr. Eulah Pont of pt's  arrival.  - will admit to Med-surg bed - Pain control: morphine prn and percocet - When necessary Zofran for nausea - Robaxin for muscle spasm - type and cross - INR/PTT -PT/OT when able to (not ordered now)  HTN:  -Continue home medications: Metoprolol -IV hydralazine prn  Diabetes mellitus without complication (HCC): Last A1c not on record. Patient is taking metformin at home. cbg 131. -SSI -Check A1c  Asthma: stable. Pt is taking prednisone 5 mg daily. -Dulera inhaler, Spiriva inhaler, as needed albuterol -Continue prednisone, -Solu-Cortef 60 mg x 1 as a stress dose - check cortisol level  Vertigo:  -prn meclizine  Fall: patient seems to have had a mechanical fall.  CT head is negative for acute intracranial abnormalities. -PT/OT when able to   DVT ppx: SCD Code Status: Full code Family Communication: None at bed side.   Disposition Plan:  Anticipate discharge back to previous home environment Consults called:  Ortho, Dr. Eulah Pont Admission status:  medical floor/inpt   Date of Service 11/15/2017    Lorretta Harp [facility] Pager [phone]  If 7PM-7AM, please contact night-coverage [email or web address] Password [email or web address] 11/15/2017, 5:30 AM

## 2017-11-14 NOTE — Progress Notes (Signed)
Orthopedic Tech Progress Note Patient Details:  Emily Bryan 02-15-1941 [id number]  Patient ID: [name], female   DOB: 1941-02-02, 77 y.o.   MRN: [id number] Pt cant have ohf due to age restrictions  [name] 11/14/2017, 11:10 PM

## 2017-11-15 ENCOUNTER — Inpatient Hospital Stay (HOSPITAL_COMMUNITY): Admission: RE | Admit: 2017-11-15 | Payer: Medicare Other | Source: Ambulatory Visit | Admitting: Orthopedic Surgery

## 2017-11-15 ENCOUNTER — Inpatient Hospital Stay (HOSPITAL_COMMUNITY): Payer: Medicare Other | Admitting: Critical Care Medicine

## 2017-11-15 ENCOUNTER — Encounter (HOSPITAL_COMMUNITY)
Admission: AD | Disposition: A | Discharge status: Home Health | Payer: Self-pay | Source: Other Acute Inpatient Hospital | Attending: Internal Medicine

## 2017-11-15 ENCOUNTER — Inpatient Hospital Stay (HOSPITAL_COMMUNITY): Payer: Medicare Other

## 2017-11-15 ENCOUNTER — Encounter (HOSPITAL_COMMUNITY): Payer: Self-pay | Admitting: Internal Medicine

## 2017-11-15 DIAGNOSIS — R011 Cardiac murmur, unspecified: Secondary | ICD-10-CM

## 2017-11-15 DIAGNOSIS — S72001A Fracture of unspecified part of neck of right femur, initial encounter for closed fracture: Secondary | ICD-10-CM

## 2017-11-15 DIAGNOSIS — J45909 Unspecified asthma, uncomplicated: Secondary | ICD-10-CM | POA: Diagnosis present

## 2017-11-15 DIAGNOSIS — W19XXXA Unspecified fall, initial encounter: Secondary | ICD-10-CM | POA: Diagnosis present

## 2017-11-15 DIAGNOSIS — E119 Type 2 diabetes mellitus without complications: Secondary | ICD-10-CM

## 2017-11-15 DIAGNOSIS — I10 Essential (primary) hypertension: Secondary | ICD-10-CM | POA: Diagnosis present

## 2017-11-15 DIAGNOSIS — R42 Dizziness and giddiness: Secondary | ICD-10-CM

## 2017-11-15 HISTORY — PX: HIP PINNING,CANNULATED: SHX1758

## 2017-11-15 LAB — CBC
HCT: 37.7 % (ref 36.0–46.0)
HEMATOCRIT: 38.1 % (ref 36.0–46.0)
Hemoglobin: 12.1 g/dL (ref 12.0–15.0)
Hemoglobin: 12.1 g/dL (ref 12.0–15.0)
MCH: 28.5 pg (ref 26.0–34.0)
MCH: 28.7 pg (ref 26.0–34.0)
MCHC: 32.1 g/dL (ref 30.0–36.0)
MCHC: 31.8 g/dL (ref 30.0–36.0)
MCV: 88.7 fL (ref 78.0–100.0)
MCV: 90.3 fL (ref 78.0–100.0)
PLATELETS: 163 10*3/uL (ref 150–400)
Platelets: 174 10*3/uL (ref 150–400)
RBC: 4.22 MIL/uL (ref 3.87–5.11)
RBC: 4.25 MIL/uL (ref 3.87–5.11)
RDW: 12.5 % (ref 11.5–15.5)
RDW: 12.9 % (ref 11.5–15.5)
WBC: 12.1 10*3/uL — ABNORMAL HIGH (ref 4.0–10.5)
WBC: 10.6 10*3/uL — AB (ref 4.0–10.5)

## 2017-11-15 LAB — HEMOGLOBIN A1C
Hgb A1c MFr Bld: 6.7 % — ABNORMAL HIGH (ref 4.8–5.6)
Mean Plasma Glucose: 145.59 mg/dL

## 2017-11-15 LAB — CREATININE, SERUM
CREATININE: 0.84 mg/dL (ref 0.44–1.00)
GFR calc Af Amer: >60 mL/min (ref >60)
GFR calc non Af Amer: >60 mL/min (ref >60)

## 2017-11-15 LAB — TYPE AND SCREEN
ABO/RH(D): O POS
ANTIBODY SCREEN: NEGATIVE

## 2017-11-15 LAB — CORTISOL-AM, BLOOD: Cortisol - AM: 16.5 ug/dL (ref 6.7–22.6)

## 2017-11-15 LAB — GLUCOSE, CAPILLARY
GLUCOSE-CAPILLARY: 253 mg/dL — AB (ref 65–99)
GLUCOSE-CAPILLARY: 216 mg/dL — AB (ref 65–99)
GLUCOSE-CAPILLARY: 176 mg/dL — AB (ref 65–99)
GLUCOSE-CAPILLARY: 188 mg/dL — AB (ref 65–99)
Glucose-Capillary: 185 mg/dL — ABNORMAL HIGH (ref 65–99)
Glucose-Capillary: 258 mg/dL — ABNORMAL HIGH (ref 65–99)

## 2017-11-15 LAB — PROTIME-INR
INR: 1.14
PROTHROMBIN TIME: 14.6 s (ref 11.4–15.2)

## 2017-11-15 LAB — BASIC METABOLIC PANEL
ANION GAP: 8 (ref 5–15)
BUN: 11 mg/dL (ref 6–20)
CALCIUM: 8.8 mg/dL — AB (ref 8.9–10.3)
CO2: 28 mmol/L (ref 22–32)
Chloride: 105 mmol/L (ref 101–111)
Creatinine, Ser: 0.9 mg/dL (ref 0.44–1.00)
GFR calc Af Amer: >60 mL/min (ref >60)
GFR calc non Af Amer: >60 mL/min (ref >60)
GLUCOSE: 119 mg/dL — AB (ref 65–99)
Potassium: 3.8 mmol/L (ref 3.5–5.1)
Sodium: 141 mmol/L (ref 135–145)

## 2017-11-15 LAB — ABO/RH: ABO/RH(D): O POS

## 2017-11-15 LAB — SURGICAL PCR SCREEN
MRSA, PCR: NEGATIVE
STAPHYLOCOCCUS AUREUS: NEGATIVE

## 2017-11-15 LAB — APTT: aPTT: 31 seconds (ref 24–36)

## 2017-11-15 SURGERY — FIXATION, FEMUR, NECK, PERCUTANEOUS, USING SCREW
Anesthesia: General | Laterality: Right

## 2017-11-15 MED ORDER — OXYCODONE HCL 5 MG PO TABS: 5.0000 mg | ORAL_TABLET | Freq: Three times a day (TID) | ORAL | 0 refills | Status: AC | PRN

## 2017-11-15 MED ORDER — METOPROLOL SUCCINATE ER 100 MG PO TB24
100.0000 mg | ORAL_TABLET | Freq: Every day | ORAL | Status: DC
Start: 2017-11-15 — End: 2017-11-17
  Administered 2017-11-15 – 2017-11-17 (×3): 100 mg via ORAL
  Filled 2017-11-15 (×3): qty 1

## 2017-11-15 MED ORDER — INSULIN ASPART 100 UNIT/ML ~~LOC~~ SOLN
0.0000 [IU] | Freq: Every day | SUBCUTANEOUS | Status: DC
  Administered 2017-11-15: 3 [IU] via SUBCUTANEOUS
  Administered 2017-11-16: 2 [IU] via SUBCUTANEOUS

## 2017-11-15 MED ORDER — HYDROMORPHONE HCL 2 MG/ML IJ SOLN
0.2500 mg | INTRAMUSCULAR | Status: DC | PRN
  Administered 2017-11-15 (×2): 0.5 mg via INTRAVENOUS

## 2017-11-15 MED ORDER — OXYCODONE HCL 5 MG/5ML PO SOLN: 5.0000 mg | Freq: Once | ORAL | Status: DC | PRN

## 2017-11-15 MED ORDER — LACTATED RINGERS IV SOLN
INTRAVENOUS | Status: DC | PRN
  Administered 2017-11-15: 14:00:00 via INTRAVENOUS

## 2017-11-15 MED ORDER — LACTATED RINGERS IV SOLN: INTRAVENOUS | Status: DC

## 2017-11-15 MED ORDER — CHLORHEXIDINE GLUCONATE 4 % EX LIQD
60.0000 mL | Freq: Once | CUTANEOUS | Status: AC
  Administered 2017-11-15: 4 via TOPICAL

## 2017-11-15 MED ORDER — CEFAZOLIN SODIUM-DEXTROSE 2-4 GM/100ML-% IV SOLN
2.0000 g | INTRAVENOUS | Status: AC
  Administered 2017-11-15: 2 g via INTRAVENOUS

## 2017-11-15 MED ORDER — 0.9 % SODIUM CHLORIDE (POUR BTL) OPTIME
TOPICAL | Status: DC | PRN
  Administered 2017-11-15: 1000 mL

## 2017-11-15 MED ORDER — METOCLOPRAMIDE HCL 5 MG PO TABS: 5.0000 mg | ORAL_TABLET | Freq: Three times a day (TID) | ORAL | Status: DC | PRN

## 2017-11-15 MED ORDER — TRIAZOLAM 0.125 MG PO TABS
0.2500 mg | ORAL_TABLET | Freq: Every day | ORAL | Status: DC
  Filled 2017-11-15: qty 2

## 2017-11-15 MED ORDER — ACETAMINOPHEN 500 MG PO TABS: 1000.0000 mg | ORAL_TABLET | Freq: Three times a day (TID) | ORAL | 0 refills | Status: AC

## 2017-11-15 MED ORDER — CEFAZOLIN SODIUM-DEXTROSE 2-4 GM/100ML-% IV SOLN
2.0000 g | Freq: Four times a day (QID) | INTRAVENOUS | Status: AC
  Administered 2017-11-15 – 2017-11-16 (×2): 2 g via INTRAVENOUS
  Filled 2017-11-15 (×2): qty 100

## 2017-11-15 MED ORDER — PREDNISONE 5 MG PO TABS
5.0000 mg | ORAL_TABLET | Freq: Every day | ORAL | Status: DC
  Administered 2017-11-16 – 2017-11-17 (×2): 5 mg via ORAL
  Filled 2017-11-15: qty 1

## 2017-11-15 MED ORDER — POLYETHYLENE GLYCOL 3350 17 G PO PACK: 17.0000 g | PACK | Freq: Every day | ORAL | Status: DC | PRN

## 2017-11-15 MED ORDER — FLUTICASONE PROPIONATE 50 MCG/ACT NA SUSP
2.0000 | Freq: Every day | NASAL | Status: DC
  Administered 2017-11-17: 2 via NASAL
  Filled 2017-11-15 (×2): qty 16

## 2017-11-15 MED ORDER — ONDANSETRON HCL 4 MG/2ML IJ SOLN: 4.0000 mg | Freq: Four times a day (QID) | INTRAMUSCULAR | Status: DC | PRN

## 2017-11-15 MED ORDER — ROCURONIUM BROMIDE 10 MG/ML (PF) SYRINGE
PREFILLED_SYRINGE | INTRAVENOUS | Status: DC | PRN
  Administered 2017-11-15: 50 mg via INTRAVENOUS

## 2017-11-15 MED ORDER — METOCLOPRAMIDE HCL 5 MG/ML IJ SOLN: 5.0000 mg | Freq: Three times a day (TID) | INTRAMUSCULAR | Status: DC | PRN

## 2017-11-15 MED ORDER — SUGAMMADEX SODIUM 200 MG/2ML IV SOLN
INTRAVENOUS | Status: DC | PRN
  Administered 2017-11-15: 200 mg via INTRAVENOUS

## 2017-11-15 MED ORDER — PHENYLEPHRINE 40 MCG/ML (10ML) SYRINGE FOR IV PUSH (FOR BLOOD PRESSURE SUPPORT)
PREFILLED_SYRINGE | INTRAVENOUS | Status: DC | PRN
  Administered 2017-11-15: 80 ug via INTRAVENOUS

## 2017-11-15 MED ORDER — LACTATED RINGERS IV SOLN
INTRAVENOUS | Status: DC
Start: 2017-11-15 — End: 2017-11-15
  Administered 2017-11-15: 13:00:00 via INTRAVENOUS

## 2017-11-15 MED ORDER — DOCUSATE SODIUM 100 MG PO CAPS
100.0000 mg | ORAL_CAPSULE | Freq: Two times a day (BID) | ORAL | Status: DC
  Administered 2017-11-15: 100 mg via ORAL
  Filled 2017-11-15: qty 1

## 2017-11-15 MED ORDER — PROMETHAZINE HCL 25 MG/ML IJ SOLN: 6.2500 mg | INTRAMUSCULAR | Status: DC | PRN

## 2017-11-15 MED ORDER — LIDOCAINE 2% (20 MG/ML) 5 ML SYRINGE
INTRAMUSCULAR | Status: DC | PRN
Start: 2017-11-15 — End: 2017-11-15
  Administered 2017-11-15: 60 mg via INTRAVENOUS

## 2017-11-15 MED ORDER — GABAPENTIN 300 MG PO CAPS
ORAL_CAPSULE | ORAL | Status: AC
  Administered 2017-11-15: 300 mg via ORAL
  Filled 2017-11-15: qty 1

## 2017-11-15 MED ORDER — MECLIZINE HCL 25 MG PO TABS
25.0000 mg | ORAL_TABLET | Freq: Three times a day (TID) | ORAL | Status: DC | PRN
  Filled 2017-11-15: qty 1

## 2017-11-15 MED ORDER — ACETAMINOPHEN 500 MG PO TABS
1000.0000 mg | ORAL_TABLET | Freq: Three times a day (TID) | ORAL | Status: AC
  Administered 2017-11-15 – 2017-11-16 (×3): 1000 mg via ORAL
  Filled 2017-11-15 (×3): qty 2

## 2017-11-15 MED ORDER — FENTANYL CITRATE (PF) 250 MCG/5ML IJ SOLN
INTRAMUSCULAR | Status: DC | PRN
  Administered 2017-11-15: 50 ug via INTRAVENOUS
  Administered 2017-11-15: 100 ug via INTRAVENOUS

## 2017-11-15 MED ORDER — POVIDONE-IODINE 10 % EX SWAB: 2.0000 "application " | Freq: Once | CUTANEOUS | Status: DC

## 2017-11-15 MED ORDER — INSULIN ASPART 100 UNIT/ML ~~LOC~~ SOLN
0.0000 [IU] | Freq: Three times a day (TID) | SUBCUTANEOUS | Status: DC
  Administered 2017-11-15 (×2): 2 [IU] via SUBCUTANEOUS
  Administered 2017-11-15: 5 [IU] via SUBCUTANEOUS
  Administered 2017-11-16: 3 [IU] via SUBCUTANEOUS
  Administered 2017-11-16 (×2): 2 [IU] via SUBCUTANEOUS
  Administered 2017-11-17: 1 [IU] via SUBCUTANEOUS
  Administered 2017-11-17: 2 [IU] via SUBCUTANEOUS

## 2017-11-15 MED ORDER — ACETAMINOPHEN 500 MG PO TABS
1000.0000 mg | ORAL_TABLET | Freq: Once | ORAL | Status: AC
  Administered 2017-11-15: 1000 mg via ORAL

## 2017-11-15 MED ORDER — MONTELUKAST SODIUM 10 MG PO TABS
10.0000 mg | ORAL_TABLET | Freq: Every day | ORAL | Status: DC
  Administered 2017-11-16 – 2017-11-17 (×2): 10 mg via ORAL
  Filled 2017-11-15 (×2): qty 1

## 2017-11-15 MED ORDER — TIOTROPIUM BROMIDE MONOHYDRATE 18 MCG IN CAPS
18.0000 ug | ORAL_CAPSULE | Freq: Every day | RESPIRATORY_TRACT | Status: DC
  Administered 2017-11-15: 18 ug via RESPIRATORY_TRACT
  Filled 2017-11-15 (×2): qty 5

## 2017-11-15 MED ORDER — EPHEDRINE SULFATE-NACL 50-0.9 MG/10ML-% IV SOSY
PREFILLED_SYRINGE | INTRAVENOUS | Status: DC | PRN
  Administered 2017-11-15: 5 mg via INTRAVENOUS
  Administered 2017-11-15: 10 mg via INTRAVENOUS

## 2017-11-15 MED ORDER — CEFAZOLIN SODIUM-DEXTROSE 2-4 GM/100ML-% IV SOLN
INTRAVENOUS | Status: AC
  Filled 2017-11-15: qty 100

## 2017-11-15 MED ORDER — ENOXAPARIN SODIUM 40 MG/0.4ML ~~LOC~~ SOLN
40.0000 mg | SUBCUTANEOUS | Status: DC
  Administered 2017-11-16 – 2017-11-17 (×2): 40 mg via SUBCUTANEOUS
  Filled 2017-11-15 (×2): qty 0.4

## 2017-11-15 MED ORDER — OXYCODONE HCL 5 MG PO TABS: 5.0000 mg | ORAL_TABLET | Freq: Once | ORAL | Status: DC | PRN

## 2017-11-15 MED ORDER — LACTATED RINGERS IV SOLN
INTRAVENOUS | Status: DC
  Administered 2017-11-15 – 2017-11-16 (×2): via INTRAVENOUS

## 2017-11-15 MED ORDER — DIAZEPAM 5 MG PO TABS: 5.0000 mg | ORAL_TABLET | Freq: Three times a day (TID) | ORAL | Status: DC | PRN

## 2017-11-15 MED ORDER — KETOROLAC TROMETHAMINE 15 MG/ML IJ SOLN
7.5000 mg | Freq: Four times a day (QID) | INTRAMUSCULAR | Status: AC
  Administered 2017-11-15 – 2017-11-16 (×4): 7.5 mg via INTRAVENOUS
  Filled 2017-11-15 (×4): qty 1

## 2017-11-15 MED ORDER — DEXAMETHASONE SODIUM PHOSPHATE 10 MG/ML IJ SOLN
INTRAMUSCULAR | Status: DC | PRN
  Administered 2017-11-15: 10 mg via INTRAVENOUS

## 2017-11-15 MED ORDER — ACETAMINOPHEN 500 MG PO TABS
ORAL_TABLET | ORAL | Status: AC
  Administered 2017-11-15: 1000 mg via ORAL
  Filled 2017-11-15: qty 2

## 2017-11-15 MED ORDER — HYDROMORPHONE HCL 2 MG/ML IJ SOLN
INTRAMUSCULAR | Status: AC
Start: 2017-11-15 — End: 2017-11-15
  Administered 2017-11-15: 0.5 mg via INTRAVENOUS
  Filled 2017-11-15: qty 1

## 2017-11-15 MED ORDER — ONDANSETRON HCL 4 MG/2ML IJ SOLN
INTRAMUSCULAR | Status: DC | PRN
  Administered 2017-11-15: 4 mg via INTRAVENOUS

## 2017-11-15 MED ORDER — GABAPENTIN 300 MG PO CAPS
300.0000 mg | ORAL_CAPSULE | Freq: Once | ORAL | Status: AC
  Administered 2017-11-15: 300 mg via ORAL

## 2017-11-15 MED ORDER — PROPOFOL 10 MG/ML IV BOLUS
INTRAVENOUS | Status: DC | PRN
  Administered 2017-11-15: 120 mg via INTRAVENOUS

## 2017-11-15 MED ORDER — METHYLPREDNISOLONE SODIUM SUCC 125 MG IJ SOLR
60.0000 mg | Freq: Once | INTRAMUSCULAR | Status: AC
  Administered 2017-11-15: 60 mg via INTRAVENOUS
  Filled 2017-11-15: qty 2

## 2017-11-15 MED ORDER — ALBUTEROL SULFATE (2.5 MG/3ML) 0.083% IN NEBU: 2.5000 mg | INHALATION_SOLUTION | RESPIRATORY_TRACT | Status: DC | PRN

## 2017-11-15 MED ORDER — ONDANSETRON HCL 4 MG PO TABS: 4.0000 mg | ORAL_TABLET | Freq: Four times a day (QID) | ORAL | Status: DC | PRN

## 2017-11-15 MED ORDER — ENOXAPARIN SODIUM 40 MG/0.4ML ~~LOC~~ SOLN: 40.0000 mg | SUBCUTANEOUS | 0 refills | Status: DC

## 2017-11-15 MED ORDER — MOMETASONE FURO-FORMOTEROL FUM 200-5 MCG/ACT IN AERO
2.0000 | INHALATION_SPRAY | Freq: Two times a day (BID) | RESPIRATORY_TRACT | Status: DC
  Administered 2017-11-15: 2 via RESPIRATORY_TRACT
  Filled 2017-11-15 (×3): qty 8.8

## 2017-11-15 MED ORDER — ONDANSETRON HCL 4 MG PO TABS: 4.0000 mg | ORAL_TABLET | Freq: Three times a day (TID) | ORAL | 0 refills | Status: DC | PRN

## 2017-11-15 SURGICAL SUPPLY — 36 items
BIT DRILL 4.9 CANNULATED (BIT) ×1
BIT DRILL CANN QC 4.9 LRG (BIT) IMPLANT
BNDG COHESIVE 4X5 TAN STRL (GAUZE/BANDAGES/DRESSINGS) ×3 IMPLANT
BNDG GAUZE ELAST 4 BULKY (GAUZE/BANDAGES/DRESSINGS) ×3 IMPLANT
CLOSURE STERI-STRIP 1/2X4 (GAUZE/BANDAGES/DRESSINGS) ×1
CLSR STERI-STRIP ANTIMIC 1/2X4 (GAUZE/BANDAGES/DRESSINGS) ×1 IMPLANT
COVER PERINEAL POST (MISCELLANEOUS) ×3 IMPLANT
COVER SURGICAL LIGHT HANDLE (MISCELLANEOUS) ×3 IMPLANT
DRAPE STERI IOBAN 125X83 (DRAPES) ×3 IMPLANT
DRILL BIT CANNULATED 4.9 (BIT) ×3
DRSG MEPILEX BORDER 4X4 (GAUZE/BANDAGES/DRESSINGS) ×3 IMPLANT
DURAPREP 26ML APPLICATOR (WOUND CARE) ×3 IMPLANT
ELECT REM PT RETURN 9FT ADLT (ELECTROSURGICAL) ×3
ELECTRODE REM PT RTRN 9FT ADLT (ELECTROSURGICAL) ×1 IMPLANT
GLOVE BIO SURGEON STRL SZ7.5 (GLOVE) ×6 IMPLANT
GLOVE BIOGEL PI IND STRL 8 (GLOVE) ×2 IMPLANT
GLOVE BIOGEL PI INDICATOR 8 (GLOVE) ×4
GOWN STRL REUS W/ TWL LRG LVL3 (GOWN DISPOSABLE) ×3 IMPLANT
GOWN STRL REUS W/TWL LRG LVL3 (GOWN DISPOSABLE) ×9
GUIDEWIRE ASNIS 3.2 NONCAL (WIRE) ×6 IMPLANT
KIT BASIN OR (CUSTOM PROCEDURE TRAY) ×3 IMPLANT
KIT TURNOVER KIT B (KITS) ×3 IMPLANT
MANIFOLD NEPTUNE II (INSTRUMENTS) ×3 IMPLANT
NS IRRIG 1000ML POUR BTL (IV SOLUTION) ×3 IMPLANT
PACK GENERAL/GYN (CUSTOM PROCEDURE TRAY) ×3 IMPLANT
PAD ARMBOARD 7.5X6 YLW CONV (MISCELLANEOUS) ×6 IMPLANT
SCREW ASNIS 75MM (Screw) ×2 IMPLANT
SCREW CANN 6.5X80 STRL (Screw) ×2 IMPLANT
SUT MNCRL AB 4-0 PS2 18 (SUTURE) ×2 IMPLANT
SUT MON AB 2-0 CT1 36 (SUTURE) ×3 IMPLANT
SUT VIC AB 0 CT1 27 (SUTURE)
SUT VIC AB 0 CT1 27XBRD ANBCTR (SUTURE) IMPLANT
SUT VIC AB 2-0 CT1 36 (SUTURE) ×2 IMPLANT
TOWEL OR 17X24 6PK STRL BLUE (TOWEL DISPOSABLE) ×3 IMPLANT
TOWEL OR 17X26 10 PK STRL BLUE (TOWEL DISPOSABLE) ×3 IMPLANT
WATER STERILE IRR 1000ML POUR (IV SOLUTION) ×3 IMPLANT

## 2017-11-15 NOTE — Anesthesia Preprocedure Evaluation (Addendum)
Anesthesia Evaluation  Patient identified by MRN, date of birth, ID band Patient awake    Reviewed: Allergy & Precautions, NPO status , Patient's Chart, lab work & pertinent test results  Airway Mallampati: II  TM Distance: >3 FB Neck ROM: Full    Dental no notable dental hx. (+) Teeth Intact, Dental Advisory Given   Pulmonary neg pulmonary ROS,    Pulmonary exam normal breath sounds clear to auscultation       Cardiovascular hypertension, negative cardio ROS Normal cardiovascular exam Rhythm:Regular Rate:Normal     Neuro/Psych negative neurological ROS  negative psych ROS   GI/Hepatic negative GI ROS, Neg liver ROS,   Endo/Other  negative endocrine ROSdiabetes  Renal/GU negative Renal ROS  negative genitourinary   Musculoskeletal negative musculoskeletal ROS (+)   Abdominal   Peds negative pediatric ROS (+)  Hematology negative hematology ROS (+)   Anesthesia Other Findings   Reproductive/Obstetrics negative OB ROS                            Anesthesia Physical Anesthesia Plan  ASA: II  Anesthesia Plan: General   Post-op Pain Management:    Induction: Intravenous  PONV Risk Score and Plan: 3 and Ondansetron, Dexamethasone and Midazolam  Airway Management Planned: Oral ETT  Additional Equipment:   Intra-op Plan:   Post-operative Plan: Extubation in OR  Informed Consent: I have reviewed the patients History and Physical, chart, labs and discussed the procedure including the risks, benefits and alternatives for the proposed anesthesia with the patient or authorized representative who has indicated his/her understanding and acceptance.   Dental advisory given  Plan Discussed with: CRNA  Anesthesia Plan Comments:         Anesthesia Quick Evaluation

## 2017-11-15 NOTE — Progress Notes (Signed)
Initial Nutrition Assessment  DOCUMENTATION CODES:   Not applicable  INTERVENTION:   -RD will follow for diet advancement and supplement as appropriate  NUTRITION DIAGNOSIS:   Increased nutrient needs related to post-op healing as evidenced by estimated needs.  GOAL:   Patient will meet greater than or equal to 90% of their needs  MONITOR:   PO intake, Diet advancement, Labs, Weight trends, Skin, I & O's  REASON FOR ASSESSMENT:   Consult Assessment of nutrition requirement/status  ASSESSMENT:   Emily Bryan is a 77 y.o. female with medical history significant of hypertension, diabetes mellitus, asthma, panic attacks, vertigo, chronic neck pain, who presents with right hip pain after fall.  Pt admitted with rt hip fx. Plan for rt hip pinning today. Pt currently NPO for procedure.   Spoke with pt at bedside, who reports good appetite PTA ("too good"). She consumes 3 meals per day and tries to eat healthfully; meals typically consist of a meat, starch, and vegetable. Pt shares that she lost about 10-20# last year related to neck surgery, but she has since regained lost weight.   Last Hgb A1c: 6.7 (11/15/17), which indicated good control. Pt reports good DM control at home, CBGS typically run from 89-105. PTA DM medications 500 mg metformin daily. Pt also on prednisone, which may elevated blood sugars.   Discussed importance of good meal intake to promote healing. Pt expresses fear of having a poor appetite post-op- encouraged pt to consume food off meal trays and assured pt that RD can add supplements if warranted.   Medications reviewed and include 5 mg prednisone daily.  Labs reviewed: CBGS: 176-253 (inpatinet orders for glycemic contorl are 0-5 units insulin aspart q HS, 0-9 units insulin aspart TID with meals,   NUTRITION - FOCUSED PHYSICAL EXAM:    Most Recent Value  Orbital Region  No depletion  Upper Arm Region  No depletion  Thoracic and Lumbar Region  No  depletion  Buccal Region  No depletion  Temple Region  No depletion  Clavicle Bone Region  No depletion  Clavicle and Acromion Bone Region  No depletion  Scapular Bone Region  No depletion  Dorsal Hand  No depletion  Patellar Region  No depletion  Anterior Thigh Region  No depletion  Posterior Calf Region  No depletion  Edema (RD Assessment)  Mild  Hair  Reviewed  Eyes  Reviewed  Mouth  Reviewed  Skin  Reviewed  Nails  Reviewed       Diet Order:   Diet Order           Diet NPO time specified Except for: Sips with Meds  Diet effective midnight        Diet NPO time specified Except for: [employer], Sips with Meds  Diet effective midnight          EDUCATION NEEDS:   Education needs have been addressed  Skin:  Skin Assessment: Reviewed RN Assessment  Last BM:  11/13/17  Height:   Ht Readings from Last 1 Encounters:  11/15/17 5' 3.5" (1.613 m)    Weight:   Wt Readings from Last 1 Encounters:  11/15/17 152 lb 1.9 oz (69 kg)    Ideal Body Weight:  53.6 kg  BMI:  Body mass index is 26.52 kg/m.  Estimated Nutritional Needs:   Kcal:  1700-1900  Protein:  85-100 grams  Fluid:  1.7-1.9 L    Tayleigh Wetherell A. Mayford Knife, RD, LDN, CDE Pager: [phone] After hours Pager: [phone]

## 2017-11-15 NOTE — H&P (View-Only) (Signed)
ORTHOPAEDIC CONSULTATION  REQUESTING PHYSICIAN: Albertine Grates, MD  Chief Complaint: Right hip pain, mechanical fall  HPI: Emily Bryan is a 77 y.o. female with a history of hypertension, DM, chronic neck pain with history of multi-level cervical fusion who complains of right hip pain after falling in her kitchen yesterday morning.  She has had several falls in the past resulting in right hip pain that did resolve.  She is also had some amount of chronic right groin pain.  After yesterday's fall she presented to the hospital in [place] where  images showed a nondisplaced subcapital femoral neck fracture.  Orthopedics was consulted for evaluation.  She was transferred to [facility] for management.   Today her pain is controlled.  Last meal was yesterday. She denies history of MI, CVA, DVT, PE.  Previously ambulatory usually without aid although with occasional use of cane or walker. The patient was living with her husband and daughter.  They have a few stairs to enter the home.    Past Medical History:  Diagnosis Date  . Asthma   . Diabetes mellitus without complication (HCC)   . Essential hypertension   . Vertigo    Past Surgical History:  Procedure Laterality Date  . CHOLECYSTECTOMY     Social History: Married, never smoker.  No EtOH.  Family History  Problem Relation Age of Onset  . Hypertension Brother    Allergies  Allergen Reactions  . Erythromycin   . Sulfa Antibiotics    Prior to Admission medications   Medication Sig Start Date End Date Taking? Authorizing Provider  diazepam (VALIUM) 5 MG tablet Take 5 mg by mouth 3 (three) times daily as needed for dizziness. 11/05/17   [provider]  HYDROcodone-acetaminophen (NORCO/VICODIN) 5-325 MG tablet Take 1-2 tablets by mouth every 6 (six) hours as needed for pain. 10/24/17   [provider]  meclizine (ANTIVERT) 25 MG tablet Take 25 mg by mouth 3 (three) times daily as needed for dizziness.  10/22/17   [provider]  montelukast (SINGULAIR) 10 MG tablet Take 10 mg by mouth daily. 11/05/17   [provider]  predniSONE (DELTASONE) 5 MG tablet Take 5 mg by mouth daily. 10/17/17   [provider]  triazolam (HALCION) 0.25 MG tablet Take 0.25 mg by mouth at bedtime. 11/13/17   [provider]   Ct Head Wo Contrast  Result Date: 11/15/2017 CLINICAL DATA:  Head trauma and ataxia EXAM: CT HEAD WITHOUT CONTRAST TECHNIQUE: Contiguous axial images were obtained from the base of the skull through the vertex without intravenous contrast. COMPARISON:  Sinus CT 06/23/2016 FINDINGS: Brain: No mass lesion, intraparenchymal hemorrhage or extra-axial collection. No evidence of acute cortical infarct. Normal appearance of the brain parenchyma and extra axial spaces for age. Vascular: No hyperdense vessel or unexpected vascular calcification. Skull: Normal visualized skull base, calvarium and extracranial soft tissues. Sinuses/Orbits: Complete opacification of the left frontal, left anterior ethmoid and left maxillary sinuses. Normal orbits. IMPRESSION: 1. Normal aging brain. 2. Left ostiomeatal complex pattern chronic sinusitis. Electronically Signed   By: Deatra Robinson M.D.   On: 11/15/2017 05:23    Positive ROS: All other systems have been reviewed and were otherwise negative with the exception of those mentioned in the HPI and as above.  Objective: Labs cbc Recent Labs    11/15/17 0040  WBC 10.6*  HGB 12.1  HCT 38.1  PLT 174    Labs coag Recent Labs    11/14/17  2324  INR 1.14    Recent Labs    11/15/17 0040  NA 141  K 3.8  CL 105  CO2 28  GLUCOSE 119*  BUN 11  CREATININE 0.90  CALCIUM 8.8*    Physical Exam: Vitals:   11/14/17 2149 11/15/17 0349  BP: (!) 153/55 (!) 147/56  Pulse: 75 80  Resp: 17 16  Temp: 99.2 F (37.3 C) 99.7 F (37.6 C)  SpO2: 100% 97%   General: Alert, no acute distress.  Supine in bed.  Calm, conversant. Mental  status: Alert and Oriented x3 Neurologic: Speech Clear and organized, no gross focal findings or movement disorder appreciated. Respiratory: No cyanosis, no use of accessory musculature Cardiovascular: RRR.  2/6 systolic ejection murmur RUSB.  No pedal edema GI: Abdomen is soft and non-tender, non-distended. Skin: Warm and dry.  No lesions in the area of chief complaint . Extremities: Warm and well perfused w/o edema Psychiatric: Patient is competent for consent with normal mood and affect  MUSCULOSKELETAL:  Right groin pain with movement of right leg.  Dorsiflexion, plantarflexion, EHL, FHL intact.  Sensation intact distally. Other extremities are atraumatic with painless ROM and NVI.  Assessment / Plan: Principal Problem:   Closed right hip fracture [facility]) Active Problems:   Essential hypertension   Diabetes mellitus without complication (HCC)   Asthma   Vertigo   Fall   Nondisplaced impacted right capital femoral neck fracture Plan for Operative fixation today -NPO  -Medicine team to admit and perform pre-op clearance -PT/OT post op -Foley okay prn for comfort  -Anticipate that she will be able to return home after mobilizing with therapy.  The risks benefits and alternatives of the procedure were discussed with the patient including but not limited to infection, bleeding, nerve injury, the need for revision surgery, blood clots, cardiopulmonary complications, morbidity, mortality, among others.  The patient  verbalizes understanding and wishes to proceed.    Weightbearing: Bedrest.  Will amend post op. VTE prophylaxis:  SCDs.  Likely Lovenox for 30 days postoperatively. Pain control:  Minimize narcotics if able.  She takes Norco chronically for her neck pain typically require use at the frequency currently prescribed. Follow-up plan: Will follow in acute inpatient post-op phase.  Plan for outpatient follow up with Dr. Wandra Feinstein in about 2 weeks. Contact information:  Margarita Rana MD, Aquilla Hacker PA-C  Albina Billet III PA-C 11/15/2017 6:49 AM

## 2017-11-15 NOTE — Transfer of Care (Signed)
Immediate Anesthesia Transfer of Care Note  Patient: Emily Bryan  Procedure(s) Performed: RIGHT HIP PINNING (Right )  Patient Location: PACU  Anesthesia Type:General  Level of Consciousness: awake, alert  and oriented  Airway & Oxygen Therapy: Patient Spontanous Breathing  Post-op Assessment: Report given to RN, Post -op Vital signs reviewed and stable and Patient moving all extremities X 4  Post vital signs: Reviewed and stable  Last Vitals:  Vitals Value Taken Time  BP 144/53 11/15/2017  3:21 PM  Temp 36.9 C 11/15/2017  3:19 PM  Pulse 74 11/15/2017  3:21 PM  Resp 11 11/15/2017  3:21 PM  SpO2 98 % 11/15/2017  3:21 PM  Vitals shown include unvalidated device data.  Last Pain:  Vitals:   11/15/17 0800  TempSrc:   PainSc: 4          Complications: No apparent anesthesia complications

## 2017-11-15 NOTE — Progress Notes (Signed)
PROGRESS NOTE  Emily Bryan WJX:[id number] DOB: 1941/01/09 DOA: 11/14/2017 PCP: Nila Nephew, MD  HPI/Recap of past 24 hours:  Patient is seen prior to surgery  Assessment/Plan: Principal Problem:   Closed right hip fracture [facility]) Active Problems:   Essential hypertension   Diabetes mellitus without complication (HCC)   Asthma   Vertigo   Fall  Closed right hip fracture [facility]): -she presented to outside hospital ED after fall at 2am  -Dr. Eulah Pont consulted, OR today -pain control, DVT prophylasix, wound care, activity level per ortho  HTN:  -Continue home medications: Metoprolol -IV hydralazine prn  Diabetes mellitus without complication [facility]):  -Patient is taking metformin at home. cbg 131. -SSI  A1c 6.1  Asthma: stable. Pt is taking prednisone 5 mg daily. -Dulera inhaler, Spiriva inhaler, as needed albuterol -Continue prednisone, -Solu-Cortef 60 mg x 1 as a stress dose  Sinusitis , flonase  Vertigo:  -prn meclizine  Ataxia, chronic, h/o neck surgery  Fall:  patient seems to have had a mechanical fall.  CT head is negative for acute intracranial abnormalities. -PT/OT when able to    Cardiac Murmur, no chest pain Reports feeling dizzy prior to the fall ekg unremarkable Will get echocardiogram (per chart review patient had an unremarkable nuclear stress test done in 2014)    Code Status: Full  Family Communication: patient and family  Disposition Plan: likely will need snf after surgery, family agrees with snf if indicated   Consultants:  ortho  Procedures: RIGHT HIP PINNING on 5/2    Antibiotics:  perioperative   Objective: BP (!) 147/56   Pulse 80   Temp 99.7 F (37.6 C) (Oral)   Resp 16   SpO2 98%   Intake/Output Summary (Last 24 hours) at 11/15/2017 1237 Last data filed at 11/15/2017 0900 Gross per 24 hour  Intake 0 ml  Output -  Net 0 ml   There were no vitals filed for this visit.  Exam: Patient is examined  daily including today on 11/15/2017, exams remain the same as of yesterday except that has changed    General:  NAD  Cardiovascular: RRR  Respiratory: CTABL  Abdomen: Soft/ND/NT, positive BS  Musculoskeletal: right hip tender  Neuro: alert, oriented   Data Reviewed: Basic Metabolic Panel: Recent Labs  Lab 11/15/17 0040  NA 141  K 3.8  CL 105  CO2 28  GLUCOSE 119*  BUN 11  CREATININE 0.90  CALCIUM 8.8*   Liver Function Tests: No results for input(s): AST, ALT, ALKPHOS, BILITOT, PROT, ALBUMIN in the last 168 hours. No results for input(s): LIPASE, AMYLASE in the last 168 hours. No results for input(s): AMMONIA in the last 168 hours. CBC: Recent Labs  Lab 11/15/17 0040  WBC 10.6*  HGB 12.1  HCT 38.1  MCV 90.3  PLT 174   Cardiac Enzymes:   No results for input(s): CKTOTAL, CKMB, CKMBINDEX, TROPONINI in the last 168 hours. BNP (last 3 results) No results for input(s): BNP in the last 8760 hours.  ProBNP (last 3 results) No results for input(s): PROBNP in the last 8760 hours.  CBG: Recent Labs  Lab 11/14/17 2340 11/15/17 0616 11/15/17 1151  GLUCAP 108* 253* 216*    Recent Results (from the past 240 hour(s))  Surgical pcr screen     Status: None   Collection Time: 11/15/17  5:38 AM  Result Value Ref Range Status   MRSA, PCR NEGATIVE NEGATIVE Final   Staphylococcus aureus NEGATIVE NEGATIVE Final    Comment: (  NOTE) The Xpert SA Assay (FDA approved for NASAL specimens in patients 98 years of age and older), is one component of a comprehensive surveillance program. It is not intended to diagnose infection nor to guide or monitor treatment. Performed at [facility], [address], [place], [place] [address]      Studies: Ct Head Wo Contrast  Result Date: 11/15/2017 CLINICAL DATA:  Head trauma and ataxia EXAM: CT HEAD WITHOUT CONTRAST TECHNIQUE: Contiguous axial images were obtained from the base of the skull through the vertex without  intravenous contrast. COMPARISON:  Sinus CT 06/23/2016 FINDINGS: Brain: No mass lesion, intraparenchymal hemorrhage or extra-axial collection. No evidence of acute cortical infarct. Normal appearance of the brain parenchyma and extra axial spaces for age. Vascular: No hyperdense vessel or unexpected vascular calcification. Skull: Normal visualized skull base, calvarium and extracranial soft tissues. Sinuses/Orbits: Complete opacification of the left frontal, left anterior ethmoid and left maxillary sinuses. Normal orbits. IMPRESSION: 1. Normal aging brain. 2. Left ostiomeatal complex pattern chronic sinusitis. Electronically Signed   By: Deatra Robinson M.D.   On: 11/15/2017 05:23    Scheduled Meds: . fluticasone  2 spray Each Nare Daily  . insulin aspart  0-5 Units Subcutaneous QHS  . insulin aspart  0-9 Units Subcutaneous TID WC  . metoprolol succinate  100 mg Oral Daily  . mometasone-formoterol  2 puff Inhalation BID  . montelukast  10 mg Oral Daily  . povidone-iodine  2 application Topical Once  . predniSONE  5 mg Oral Daily  . tiotropium  18 mcg Inhalation Daily  . triazolam  0.25 mg Oral QHS    Continuous Infusions:   Time spent: 25 mins I have personally reviewed and interpreted on  11/15/2017 daily labs, tele strips, imagings as discussed above under date review session and assessment and plans.  I reviewed all nursing notes, pharmacy notes, consultant notes,  vitals, pertinent old records  I have discussed plan of care as described above with RN , patient and family on 11/15/2017   Albertine Grates MD, PhD  [facility] Pager [phone]. If 7PM-7AM, please contact night-coverage at [email or web address], password [facility] 11/15/2017, 12:37 PM  LOS: 1 day

## 2017-11-15 NOTE — Interval H&P Note (Signed)
History and Physical Interval Note:  11/15/2017 2:09 PM  Emily Bryan  has presented today for surgery, with the diagnosis of RIGHT HIP FRACTURE  The various methods of treatment have been discussed with the patient and family. After consideration of risks, benefits and other options for treatment, the patient has consented to  Procedure(s): RIGHT HIP PINNING (Right) as a surgical intervention .  The patient's history has been reviewed, patient examined, no change in status, stable for surgery.  I have reviewed the patient's chart and labs.  Questions were answered to the patient's satisfaction.     MURPHY, TIMOTHY D

## 2017-11-15 NOTE — Consult Note (Signed)
ORTHOPAEDIC CONSULTATION  REQUESTING PHYSICIAN: Albertine Grates, MD  Chief Complaint: Right hip pain, mechanical fall  HPI: Emily Bryan is a 77 y.o. female with a history of hypertension, DM, chronic neck pain with history of multi-level cervical fusion who complains of right hip pain after falling in her kitchen yesterday morning.  She has had several falls in the past resulting in right hip pain that did resolve.  She is also had some amount of chronic right groin pain.  After yesterday's fall she presented to the hospital in [place] where  images showed a nondisplaced subcapital femoral neck fracture.  Orthopedics was consulted for evaluation.  She was transferred to [facility] for management.   Today her pain is controlled.  Last meal was yesterday. She denies history of MI, CVA, DVT, PE.  Previously ambulatory usually without aid although with occasional use of cane or walker. The patient was living with her husband and daughter.  They have a few stairs to enter the home.    Past Medical History:  Diagnosis Date  . Asthma   . Diabetes mellitus without complication (HCC)   . Essential hypertension   . Vertigo    Past Surgical History:  Procedure Laterality Date  . CHOLECYSTECTOMY     Social History: Married, never smoker.  No EtOH.  Family History  Problem Relation Age of Onset  . Hypertension Brother    Allergies  Allergen Reactions  . Erythromycin   . Sulfa Antibiotics    Prior to Admission medications   Medication Sig Start Date End Date Taking? Authorizing Provider  diazepam (VALIUM) 5 MG tablet Take 5 mg by mouth 3 (three) times daily as needed for dizziness. 11/05/17   [provider]  HYDROcodone-acetaminophen (NORCO/VICODIN) 5-325 MG tablet Take 1-2 tablets by mouth every 6 (six) hours as needed for pain. 10/24/17   [provider]  meclizine (ANTIVERT) 25 MG tablet Take 25 mg by mouth 3 (three) times daily as needed for dizziness.  10/22/17   [provider]  montelukast (SINGULAIR) 10 MG tablet Take 10 mg by mouth daily. 11/05/17   [provider]  predniSONE (DELTASONE) 5 MG tablet Take 5 mg by mouth daily. 10/17/17   [provider]  triazolam (HALCION) 0.25 MG tablet Take 0.25 mg by mouth at bedtime. 11/13/17   [provider]   Ct Head Wo Contrast  Result Date: 11/15/2017 CLINICAL DATA:  Head trauma and ataxia EXAM: CT HEAD WITHOUT CONTRAST TECHNIQUE: Contiguous axial images were obtained from the base of the skull through the vertex without intravenous contrast. COMPARISON:  Sinus CT 06/23/2016 FINDINGS: Brain: No mass lesion, intraparenchymal hemorrhage or extra-axial collection. No evidence of acute cortical infarct. Normal appearance of the brain parenchyma and extra axial spaces for age. Vascular: No hyperdense vessel or unexpected vascular calcification. Skull: Normal visualized skull base, calvarium and extracranial soft tissues. Sinuses/Orbits: Complete opacification of the left frontal, left anterior ethmoid and left maxillary sinuses. Normal orbits. IMPRESSION: 1. Normal aging brain. 2. Left ostiomeatal complex pattern chronic sinusitis. Electronically Signed   By: Deatra Robinson M.D.   On: 11/15/2017 05:23    Positive ROS: All other systems have been reviewed and were otherwise negative with the exception of those mentioned in the HPI and as above.  Objective: Labs cbc Recent Labs    11/15/17 0040  WBC 10.6*  HGB 12.1  HCT 38.1  PLT 174    Labs coag Recent Labs    11/14/17  2324  INR 1.14    Recent Labs    11/15/17 0040  NA 141  K 3.8  CL 105  CO2 28  GLUCOSE 119*  BUN 11  CREATININE 0.90  CALCIUM 8.8*    Physical Exam: Vitals:   11/14/17 2149 11/15/17 0349  BP: (!) 153/55 (!) 147/56  Pulse: 75 80  Resp: 17 16  Temp: 99.2 F (37.3 C) 99.7 F (37.6 C)  SpO2: 100% 97%   General: Alert, no acute distress.  Supine in bed.  Calm, conversant. Mental  status: Alert and Oriented x3 Neurologic: Speech Clear and organized, no gross focal findings or movement disorder appreciated. Respiratory: No cyanosis, no use of accessory musculature Cardiovascular: RRR.  2/6 systolic ejection murmur RUSB.  No pedal edema GI: Abdomen is soft and non-tender, non-distended. Skin: Warm and dry.  No lesions in the area of chief complaint . Extremities: Warm and well perfused w/o edema Psychiatric: Patient is competent for consent with normal mood and affect  MUSCULOSKELETAL:  Right groin pain with movement of right leg.  Dorsiflexion, plantarflexion, EHL, FHL intact.  Sensation intact distally. Other extremities are atraumatic with painless ROM and NVI.  Assessment / Plan: Principal Problem:   Closed right hip fracture (HCC) Active Problems:   Essential hypertension   Diabetes mellitus without complication (HCC)   Asthma   Vertigo   Fall   Nondisplaced impacted right capital femoral neck fracture Plan for Operative fixation today -NPO  -Medicine team to admit and perform pre-op clearance -PT/OT post op -Foley okay prn for comfort  -Anticipate that she will be able to return home after mobilizing with therapy.  The risks benefits and alternatives of the procedure were discussed with the patient including but not limited to infection, bleeding, nerve injury, the need for revision surgery, blood clots, cardiopulmonary complications, morbidity, mortality, among others.  The patient  verbalizes understanding and wishes to proceed.    Weightbearing: Bedrest.  Will amend post op. VTE prophylaxis:  SCDs.  Likely Lovenox for 30 days postoperatively. Pain control:  Minimize narcotics if able.  She takes Norco chronically for her neck pain typically require use at the frequency currently prescribed. Follow-up plan: Will follow in acute inpatient post-op phase.  Plan for outpatient follow up with Dr. T. Murphy in about 2 weeks. Contact information:  Timothy  Murphy MD, Teondra Newburg Martensen PA-C  Aulden Calise Calvin Martensen III PA-C 11/15/2017 6:49 AM  

## 2017-11-15 NOTE — Op Note (Signed)
11/14/2017 - 11/15/2017  2:56 PM  PATIENT:  Emily Bryan    PRE-OPERATIVE DIAGNOSIS:  RIGHT HIP FRACTURE  POST-OPERATIVE DIAGNOSIS:  Same  PROCEDURE:  RIGHT HIP PINNING  SURGEON:  Delorese Sellin, Jewel Baize, MD  ASSISTANT: Aquilla Hacker, PA-C, he was present and scrubbed throughout the case, critical for completion in a timely fashion, and for retraction, instrumentation, and closure.   ANESTHESIA:   General  PREOPERATIVE INDICATIONS:  [name] is a  77 y.o. female who fell and was found to have a diagnosis of RIGHT HIP FRACTURE who elected for surgical management.    The risks benefits and alternatives were discussed with the patient preoperatively including but not limited to the risks of infection, bleeding, nerve injury, cardiopulmonary complications, blood clots, malunion, nonunion, avascular necrosis, the need for revision surgery, the potential for conversion to hemiarthroplasty, among others, and the patient was willing to proceed.  OPERATIVE IMPLANTS: 6.5 mm cannulated screws x3  OPERATIVE FINDINGS: Clinical osteoporosis with weak bone, proximal femur  OPERATIVE PROCEDURE: The patient was brought to the operating room and placed in supine position. IV antibiotics were given. General anesthesia administered. Foley was also given. The patient was placed on the fracture table. The operative extremity was positioned, without any significant reduction maneuver and was prepped and draped in usual sterile fashion.  Time out was performed.  Small incisions were made distal to the greater trochanter, and 3 guidewires were introduced Into an inverted triangle configuration. The lengths were measured. The reduction was slightly valgus, and near-anatomic. I opened the cortex with a cannulated drill, and then placed the screws into position. Satisfactory fixation was achieved. I sequentially tightened the screws by hand.  I performed a live fluoroscopic exam and no screw penetrance was  noted. All threads crossed the fracture site.   The wounds were irrigated copiously, and repaired with Vicryl with Steri-Strips and sterile gauze. There no complications and the patient tolerated the procedure well.  The patient will be weightbearing as tolerated, VTE prophylaxis will be: mobilization and chemical px

## 2017-11-15 NOTE — Anesthesia Procedure Notes (Signed)
Procedure Name: Intubation Date/Time: 11/15/2017 2:25 PM Performed by: Nils Pyle, CRNA Pre-anesthesia Checklist: Patient identified, Emergency Drugs available, Suction available and Patient being monitored Patient Re-evaluated:Patient Re-evaluated prior to induction Oxygen Delivery Method: Circle System Utilized Preoxygenation: Pre-oxygenation with 100% oxygen Induction Type: IV induction Ventilation: Mask ventilation without difficulty Laryngoscope Size: Miller and 2 Grade View: Grade I Tube type: Oral Tube size: 7.0 mm Number of attempts: 1 Airway Equipment and Method: Stylet and Oral airway Placement Confirmation: ETT inserted through vocal cords under direct vision,  positive ETCO2 and breath sounds checked- equal and bilateral Secured at: 21 cm Tube secured with: Tape Dental Injury: Teeth and Oropharynx as per pre-operative assessment

## 2017-11-15 NOTE — Discharge Instructions (Signed)
You may bear weight as tolerated. Keep dressings on and dry until follow up.

## 2017-11-16 ENCOUNTER — Inpatient Hospital Stay (HOSPITAL_COMMUNITY): Payer: Medicare Other

## 2017-11-16 ENCOUNTER — Encounter (HOSPITAL_COMMUNITY): Payer: Self-pay | Admitting: Orthopedic Surgery

## 2017-11-16 ENCOUNTER — Inpatient Hospital Stay: Payer: Self-pay

## 2017-11-16 DIAGNOSIS — I361 Nonrheumatic tricuspid (valve) insufficiency: Secondary | ICD-10-CM

## 2017-11-16 LAB — GLUCOSE, CAPILLARY
Glucose-Capillary: 192 mg/dL — ABNORMAL HIGH (ref 65–99)
Glucose-Capillary: 165 mg/dL — ABNORMAL HIGH (ref 65–99)
Glucose-Capillary: 203 mg/dL — ABNORMAL HIGH (ref 65–99)
Glucose-Capillary: 228 mg/dL — ABNORMAL HIGH (ref 65–99)

## 2017-11-16 LAB — BASIC METABOLIC PANEL
Anion gap: 11 (ref 5–15)
BUN: 19 mg/dL (ref 6–20)
CO2: 25 mmol/L (ref 22–32)
Calcium: 9.1 mg/dL (ref 8.9–10.3)
Chloride: 102 mmol/L (ref 101–111)
Creatinine, Ser: 1 mg/dL (ref 0.44–1.00)
GFR calc Af Amer: >60 mL/min (ref >60)
GFR, EST NON AFRICAN AMERICAN: 53 mL/min — AB (ref >60)
Glucose, Bld: 239 mg/dL — ABNORMAL HIGH (ref 65–99)
Potassium: 4.2 mmol/L (ref 3.5–5.1)
SODIUM: 138 mmol/L (ref 135–145)

## 2017-11-16 LAB — CBC
HCT: 34.9 % — ABNORMAL LOW (ref 36.0–46.0)
Hemoglobin: 11.1 g/dL — ABNORMAL LOW (ref 12.0–15.0)
MCH: 28.2 pg (ref 26.0–34.0)
MCHC: 31.8 g/dL (ref 30.0–36.0)
MCV: 88.6 fL (ref 78.0–100.0)
PLATELETS: 152 10*3/uL (ref 150–400)
RBC: 3.94 MIL/uL (ref 3.87–5.11)
RDW: 12.4 % (ref 11.5–15.5)
WBC: 14.3 10*3/uL — ABNORMAL HIGH (ref 4.0–10.5)

## 2017-11-16 LAB — MAGNESIUM: MAGNESIUM: 1.6 mg/dL — AB (ref 1.7–2.4)

## 2017-11-16 LAB — ECHOCARDIOGRAM COMPLETE
HEIGHTINCHES: 63.5 in
Weight: 2433.88 oz

## 2017-11-16 MED ORDER — GABAPENTIN 100 MG PO CAPS
200.0000 mg | ORAL_CAPSULE | Freq: Every day | ORAL | Status: DC
  Administered 2017-11-16: 200 mg via ORAL
  Filled 2017-11-16: qty 2

## 2017-11-16 MED ORDER — MOMETASONE FURO-FORMOTEROL FUM 200-5 MCG/ACT IN AERO: 2.0000 | INHALATION_SPRAY | Freq: Two times a day (BID) | RESPIRATORY_TRACT | Status: DC

## 2017-11-16 MED ORDER — MAGNESIUM SULFATE 2 GM/50ML IV SOLN
2.0000 g | Freq: Once | INTRAVENOUS | Status: AC
  Administered 2017-11-16: 2 g via INTRAVENOUS
  Filled 2017-11-16: qty 50

## 2017-11-16 MED ORDER — SENNOSIDES-DOCUSATE SODIUM 8.6-50 MG PO TABS
1.0000 | ORAL_TABLET | Freq: Two times a day (BID) | ORAL | Status: DC
  Administered 2017-11-16 – 2017-11-17 (×3): 1 via ORAL
  Filled 2017-11-16 (×3): qty 1

## 2017-11-16 MED ORDER — TIOTROPIUM BROMIDE MONOHYDRATE 18 MCG IN CAPS: 18.0000 ug | ORAL_CAPSULE | Freq: Every day | RESPIRATORY_TRACT | Status: DC | PRN

## 2017-11-16 MED ORDER — GABAPENTIN 100 MG PO CAPS: 100.0000 mg | ORAL_CAPSULE | ORAL | Status: DC

## 2017-11-16 MED ORDER — ACETAMINOPHEN 325 MG PO TABS
650.0000 mg | ORAL_TABLET | Freq: Four times a day (QID) | ORAL | Status: DC
  Administered 2017-11-16 – 2017-11-17 (×5): 650 mg via ORAL
  Filled 2017-11-16 (×4): qty 2

## 2017-11-16 MED ORDER — LORATADINE 10 MG PO TABS
10.0000 mg | ORAL_TABLET | Freq: Every day | ORAL | Status: DC
  Administered 2017-11-16 – 2017-11-17 (×2): 10 mg via ORAL
  Filled 2017-11-16 (×3): qty 1

## 2017-11-16 MED ORDER — GABAPENTIN 100 MG PO CAPS
100.0000 mg | ORAL_CAPSULE | Freq: Two times a day (BID) | ORAL | Status: DC
  Administered 2017-11-17: 100 mg via ORAL
  Filled 2017-11-16: qty 1

## 2017-11-16 NOTE — Progress Notes (Addendum)
PROGRESS NOTE  Emily Bryan ZOX:[id number] DOB: Jun 10, 1941 DOA: 11/14/2017 PCP: Nila Nephew, MD  HPI/Recap of past 24 hours:  POD #1, denies pain at rest, no fever,  No sob  Assessment/Plan: Principal Problem:   Closed right hip fracture [facility]) Active Problems:   Essential hypertension   Diabetes mellitus without complication (HCC)   Asthma   Vertigo   Fall  Closed right hip fracture [facility]): -she presented to outside hospital ED after fall at 2am  -s/p RIGHT HIP PINNING on 5/2 -pain control, DVT prophylasix, wound care, activity level per ortho "Weightbearing: WBAT RLE Insicional and dressing care: Dressings left intact until follow-up Showering: Keep dressing dry VTE prophylaxis: Lovenox  qd 30 days, SCDs, ambulation Pain control: Minimize narcotics if able.  Tylenol preferred.  Takes hydrocodone chronically for her neck.  Oxycodone for breakthrough pain only.   Follow - up plan: Follow up in the office with Dr. Wandra Feinstein in 2 weeks. "    Hypomagnesemia: replace mag  HTN:  -Continue home medications: Metoprolol -IV hydralazine prn  Diabetes mellitus without complication [facility]):  -Patient is taking metformin at home. cbg 131. -SSI  A1c 6.1  Asthma: stable. Pt is taking prednisone 5 mg daily. -Dulera inhaler, Spiriva inhaler, as needed albuterol -Continue prednisone, -Solu-Cortef 60 mg x 1 as a stress dose  Sinusitis , flonase  Vertigo:  -prn meclizine  Ataxia, chronic, h/o neck surgery  Fall:  patient seems to have had a mechanical fall.  CT head is negative for acute intracranial abnormalities. -PT/OT when able to    Cardiac Murmur, no chest pain Reports feeling dizzy prior to the fall ekg unremarkable Echocardiogram lvef wnl,  Aortic valve:   Trileaflet; mildly thickened leaflets . Mitral valve:   Calcified annulus. Mildly thickened leaflets .Mobility was not restricted. (per chart review patient had an unremarkable nuclear stress  test done in 2014)    Code Status: Full  Family Communication: patient and family  Disposition Plan: home with home health , likely on 5/4   Consultants:  ortho  Procedures: RIGHT HIP PINNING on 5/2    Antibiotics:  perioperative   Objective: BP (!) 142/63 (BP Location: Right Arm)   Pulse 70   Temp (!) 97.5 F (36.4 C) (Oral)   Resp (!) 22   Ht 5' 3.5" (1.613 m)   Wt 69 kg (152 lb 1.9 oz)   SpO2 100%   BMI 26.52 kg/m   Intake/Output Summary (Last 24 hours) at 11/16/2017 0852 Last data filed at 11/16/2017 0500 Gross per 24 hour  Intake 960 ml  Output 1420 ml  Net -460 ml   Filed Weights   11/15/17 1007  Weight: 69 kg (152 lb 1.9 oz)    Exam: Patient is examined daily including today on 11/16/2017, exams remain the same as of yesterday except that has changed    General:  NAD  Cardiovascular: RRR  Respiratory: CTABL  Abdomen: Soft/ND/NT, positive BS  Musculoskeletal: right hip post op changes, dressing intact  Neuro: alert, oriented   Data Reviewed: Basic Metabolic Panel: Recent Labs  Lab 11/15/17 0040 11/15/17 1719  NA 141  --   K 3.8  --   CL 105  --   CO2 28  --   GLUCOSE 119*  --   BUN 11  --   CREATININE 0.90 0.84  CALCIUM 8.8*  --    Liver Function Tests: No results for input(s): AST, ALT, ALKPHOS, BILITOT, PROT, ALBUMIN in the last 168  hours. No results for input(s): LIPASE, AMYLASE in the last 168 hours. No results for input(s): AMMONIA in the last 168 hours. CBC: Recent Labs  Lab 11/15/17 0040 11/15/17 1719  WBC 10.6* 12.1*  HGB 12.1 12.1  HCT 38.1 37.7  MCV 90.3 88.7  PLT 174 163   Cardiac Enzymes:   No results for input(s): CKTOTAL, CKMB, CKMBINDEX, TROPONINI in the last 168 hours. BNP (last 3 results) No results for input(s): BNP in the last 8760 hours.  ProBNP (last 3 results) No results for input(s): PROBNP in the last 8760 hours.  CBG: Recent Labs  Lab 11/15/17 1238 11/15/17 1528 11/15/17 1846  11/15/17 2135 11/16/17 0615  GLUCAP 185* 176* 188* 258* 192*    Recent Results (from the past 240 hour(s))  Surgical pcr screen     Status: None   Collection Time: 11/15/17  5:38 AM  Result Value Ref Range Status   MRSA, PCR NEGATIVE NEGATIVE Final   Staphylococcus aureus NEGATIVE NEGATIVE Final    Comment: (NOTE) The Xpert SA Assay (FDA approved for NASAL specimens in patients 52 years of age and older), is one component of a comprehensive surveillance program. It is not intended to diagnose infection nor to guide or monitor treatment. Performed at [facility], [address], [place], [place] [address]      Studies: Dg C-arm 1-60 Min  Result Date: 11/15/2017 CLINICAL DATA:  Screw fixation for fracture EXAM: OPERATIVE RIGHT HIP  3 VIEWS TECHNIQUE: Fluoroscopic spot image(s) were submitted for interpretation post-operatively. FLUOROSCOPY TIME:  0 minutes 21 seconds; 3 acquired images COMPARISON:  Pelvis and right hip Nov 14, 2017. FINDINGS: Frontal, oblique, and lateral images obtained. There are 3 screws transfixing the subcapital femoral neck with alignment anatomic in this area. Tips of the screws are in the femoral head. No new fracture. No dislocation. Right hip joint appears unremarkable. IMPRESSION: Three screws transfix the subcapital femoral neck region on the right with alignment anatomic. Tips of screws in proximal femoral head. No dislocation. No new fracture. Electronically Signed   By: Bretta Bang III M.D.   On: 11/15/2017 15:41   Dg Hip Operative Unilat W Or W/o Pelvis Right  Result Date: 11/15/2017 CLINICAL DATA:  Screw fixation for fracture EXAM: OPERATIVE RIGHT HIP  3 VIEWS TECHNIQUE: Fluoroscopic spot image(s) were submitted for interpretation post-operatively. FLUOROSCOPY TIME:  0 minutes 21 seconds; 3 acquired images COMPARISON:  Pelvis and right hip Nov 14, 2017. FINDINGS: Frontal, oblique, and lateral images obtained. There are 3 screws transfixing the  subcapital femoral neck with alignment anatomic in this area. Tips of the screws are in the femoral head. No new fracture. No dislocation. Right hip joint appears unremarkable. IMPRESSION: Three screws transfix the subcapital femoral neck region on the right with alignment anatomic. Tips of screws in proximal femoral head. No dislocation. No new fracture. Electronically Signed   By: Bretta Bang III M.D.   On: 11/15/2017 15:41    Scheduled Meds: . acetaminophen  650 mg Oral Q6H  . enoxaparin (LOVENOX) injection  40 mg Subcutaneous Q24H  . fluticasone  2 spray Each Nare Daily  . insulin aspart  0-5 Units Subcutaneous QHS  . insulin aspart  0-9 Units Subcutaneous TID WC  . ketorolac  7.5 mg Intravenous Q6H  . metoprolol succinate  100 mg Oral Daily  . mometasone-formoterol  2 puff Inhalation BID  . montelukast  10 mg Oral Daily  . predniSONE  5 mg Oral Daily  .  senna-docusate  1 tablet Oral BID  . tiotropium  18 mcg Inhalation Daily  . triazolam  0.25 mg Oral QHS    Continuous Infusions: . lactated ringers 100 mL/hr at 11/15/17 1802     Time spent: 25 mins I have personally reviewed and interpreted on  11/16/2017 daily labs, tele strips, imagings as discussed above under date review session and assessment and plans.  I reviewed all nursing notes, pharmacy notes, consultant notes,  vitals, pertinent old records  I have discussed plan of care as described above with RN , patient on 11/16/2017   Albertine Grates MD, PhD  [facility] Pager [phone]. If 7PM-7AM, please contact night-coverage at [email or web address], password [facility] 11/16/2017, 8:52 AM  LOS: 2 days

## 2017-11-16 NOTE — Evaluation (Signed)
Physical Therapy Evaluation Patient Details Name: Emily Bryan MRN: [id number] DOB: Dec 21, 1940 Today's Date: 11/16/2017   History of Present Illness  Pt is a 77 y/o female s/p R hip pinning after sustaining a fall. PMH including but not limited to DM and HTN.  Clinical Impression  Pt presented supine in bed with HOB elevated, awake and willing to participate in therapy session. Prior to admission, pt reported that she ambulated with [facility] when outside of her home and without an AD inside the home. Pt will have 24/7 assistance from family upon d/c. Pt currently able to perform bed mobility with supervision, transfers with min guard and ambulated within her room (~50') with RW and min guard. Pt moved surprisingly very well following her surgery on 5/2. PT will continue to follow pt acutely to progress mobility as tolerated and will need to perform stair training with pt at next session prior to her d/c.  Pt would continue to benefit from skilled physical therapy services at this time while admitted and after d/c to address the below listed limitations in order to improve overall safety and independence with functional mobility.     Follow Up Recommendations Home health PT;Supervision/Assistance - 24 hour    Equipment Recommendations  None recommended by PT    Recommendations for Other Services       Precautions / Restrictions Precautions Precautions: Fall Restrictions Weight Bearing Restrictions: Yes      Mobility  Bed Mobility Overal bed mobility: Needs Assistance Bed Mobility: Supine to Sit     Supine to sit: Supervision     General bed mobility comments: increased time and effort, supervision for safety  Transfers Overall transfer level: Needs assistance Equipment used: Rolling walker (2 wheeled) Transfers: Sit to/from Stand Sit to Stand: Min guard         General transfer comment: min guard for safety, good technique with RW  Ambulation/Gait Ambulation/Gait  assistance: Min guard Ambulation Distance (Feet): 50 Feet Assistive device: Rolling walker (2 wheeled) Gait Pattern/deviations: Step-to pattern;Step-through pattern;Decreased step length - right;Decreased step length - left;Decreased stride length;Decreased weight shift to right Gait velocity: decreased Gait velocity interpretation: <1.31 ft/sec, indicative of household ambulator General Gait Details: pt with mild instability but no overt LOB or need for physical assistance; pt able to tolerate full WB'ing through R LE   Stairs            Wheelchair Mobility    Modified Rankin (Stroke Patients Only)       Balance Overall balance assessment: Needs assistance;History of Falls Sitting-balance support: Feet supported Sitting balance-Leahy Scale: Good     Standing balance support: During functional activity;Single extremity supported;Bilateral upper extremity supported Standing balance-Leahy Scale: Poor                               Pertinent Vitals/Pain Pain Assessment: 0-10 Pain Score: 4  Pain Location: R hip Pain Descriptors / Indicators: Sore Pain Intervention(s): Monitored during session;Repositioned    Home Living Family/patient expects to be discharged to:: Private residence Living Arrangements: Spouse/significant other;Children Available Help at Discharge: Family;Available 24 hours/day Type of Home: House Home Access: Stairs to enter Entrance Stairs-Rails: Right;Left(hand rails with the 7 steps) Entrance Stairs-Number of Steps: 3 or 7 Home Layout: One level Home Equipment: Cane - single point;Walker - 2 wheels;Shower seat;Walker - 4 wheels      Prior Function Level of Independence: Independent with assistive device(s)  Comments: used SPC when in community     Hand Dominance   Dominant Hand: Right    Extremity/Trunk Assessment   Upper Extremity Assessment Upper Extremity Assessment: Defer to OT evaluation    Lower Extremity  Assessment Lower Extremity Assessment: Generalized weakness;RLE deficits/detail RLE Deficits / Details: pt with expected post-op pain and weakness       Communication   Communication: No difficulties  Cognition Arousal/Alertness: Awake/alert Behavior During Therapy: WFL for tasks assessed/performed Overall Cognitive Status: Within Functional Limits for tasks assessed                                        General Comments      Exercises     Assessment/Plan    PT Assessment Patient needs continued PT services  PT Problem List Decreased balance;Decreased mobility;Decreased coordination;Decreased knowledge of use of DME;Decreased safety awareness;Decreased knowledge of precautions;Pain       PT Treatment Interventions DME instruction;Stair training;Gait training;Functional mobility training;Therapeutic activities;Therapeutic exercise;Balance training;Neuromuscular re-education;Patient/family education    PT Goals (Current goals can be found in the Care Plan section)  Acute Rehab PT Goals Patient Stated Goal: return home, stop falling PT Goal Formulation: With patient Time For Goal Achievement: 11/30/17 Potential to Achieve Goals: Good    Frequency Min 3X/week   Barriers to discharge        Co-evaluation               AM-PAC PT "6 Clicks" Daily Activity  Outcome Measure Difficulty turning over in bed (including adjusting bedclothes, sheets and blankets)?: A Little Difficulty moving from lying on back to sitting on the side of the bed? : A Little Difficulty sitting down on and standing up from a chair with arms (e.g., wheelchair, bedside commode, etc,.)?: Unable Help needed moving to and from a bed to chair (including a wheelchair)?: A Little Help needed walking in hospital room?: A Little Help needed climbing 3-5 steps with a railing? : A Little 6 Click Score: 16    End of Session Equipment Utilized During Treatment: Gait belt Activity  Tolerance: Patient tolerated treatment well Patient left: in chair;with call bell/phone within reach Nurse Communication: Mobility status PT Visit Diagnosis: Other abnormalities of gait and mobility (R26.89);Pain Pain - Right/Left: Right Pain - part of body: Hip    Time: 1026-1050 PT Time Calculation (min) (ACUTE ONLY): 24 min   Charges:   PT Evaluation $PT Eval Moderate Complexity: 1 Mod PT Treatments $Therapeutic Activity: 8-22 mins   PT G Codes:        [place], PT, DPT [phone]   Alessandra Bevels Jillaine Waren 11/16/2017, 11:50 AM

## 2017-11-16 NOTE — Progress Notes (Signed)
    Subjective: Patient reports pain as mild.  Tolerating diet.  Urinating.  +Flatus.  No CP, SOB.  Not yet OOB.  Hoping to go home soon.  Objective:   VITALS:   Vitals:   11/15/17 1637 11/15/17 1704 11/15/17 2100 11/16/17 0449  BP: 100/80 (!) 158/80 (!) 140/55 (!) 142/63  Pulse: 65 67 77 70  Resp: (!) 22  Temp: 98.6 F (37 C) 97.8 F (36.6 C) (!) 97.5 F (36.4 C) (!) 97.5 F (36.4 C)  TempSrc:  Oral Oral Oral  SpO2: 100% 100% 100% 100%  Weight:      Height:       CBC Latest Ref Rng & Units 11/15/2017 11/15/2017  WBC 4.0 - 10.5 K/uL 12.1(H) 10.6(H)  Hemoglobin 12.0 - 15.0 g/dL 16.1 09.6  Hematocrit 04.5 - 46.0 % 37.7 38.1  Platelets 150 - 400 K/uL 163 174   BMP Latest Ref Rng & Units 11/15/2017 11/15/2017  Glucose 65 - 99 mg/dL - 409(W)  BUN 6 - 20 mg/dL - 11  Creatinine 1.19 - 1.00 mg/dL 1.47 8.29  Sodium 562 - 145 mmol/L - 141  Potassium 3.5 - 5.1 mmol/L - 3.8  Chloride 101 - 111 mmol/L - 105  CO2 22 - 32 mmol/L - 28  Calcium 8.9 - 10.3 mg/dL - 8.8(L)   Intake/Output      05/02 0701 - 05/03 0700 05/03 0701 - 05/04 0700   P.O. 360    I.V. (mL/kg) 600 (8.7)    Total Intake(mL/kg) 960 (13.9)    Urine (mL/kg/hr) 1400 (0.8)    Blood 20    Total Output 1420    Net -460            Physical Exam: General: NAD.  Supine in bed.  Calm, conversant.  No increased work of breathing. MSK RLE: Neurovascularly intact Sensation intact distally Feet warm Dorsiflexion/Plantar flexion intact Incision: dressing C/D/I   Assessment: 1 Day Post-Op  S/P Procedure(s) (LRB): RIGHT HIP PINNING (Right) by Dr. Jewel Baize. Eulah Pont on 11/15/2017  Principal Problem:   Closed right hip fracture [facility]) Active Problems:   Essential hypertension   Diabetes mellitus without complication (HCC)   Asthma   Vertigo   Fall   Closed nondisplaced right femoral neck fracture, status post right hip pinning Doing well postop day 1. Eating, drinking, and voiding. Pain control. She has  not yet been up out of bed.  Plan: Up with therapy Incentive Spirometry Apply ice as needed Bowel regimen  Weightbearing: WBAT RLE Insicional and dressing care: Dressings left intact until follow-up Showering: Keep dressing dry VTE prophylaxis: Lovenox  qd 30 days, SCDs, ambulation Pain control: Minimize narcotics if able.  Tylenol preferred.  Takes hydrocodone chronically for her neck.  Oxycodone for breakthrough pain only.   Follow - up plan: Follow up in the office with Dr. Wandra Feinstein in 2 weeks.   Dispo: TBD.  Stable from an orthopedic perspective.  Therapy evaluations pending.  Patient has good support and is hoping to go home.  She has a walker and cane at home.  There are a few stairs to enter the house. Discharge per medicine team when ready medically and as mobilized.    Emily Kern Martensen III, PA-C 11/16/2017, 7:30 AM

## 2017-11-16 NOTE — Care Management Note (Addendum)
Case Management Note  Patient Details  Name: Emily Bryan MRN: [id number] Date of Birth: Mar 27, 1941  Subjective/Objective: 77 yr old female s/p right hip pinning.                   Action/Plan: Case manager spoke with patient concerning discharge plan and DME. Choice for Home Health Agency was offered, referral was called to Shon Millet RN, [facility] Liaison. Patient has RW, requests 3in1 if covered by insurance. Patient will have family support at discharge.   Expected Discharge Date:   11/17/17               Expected Discharge Plan:  Home w Home Health Services  In-House Referral:  NA  Discharge planning Services  CM Consult  Post Acute Care Choice:  Home Health Choice offered to:  Patient  DME Arranged:  3-N-1 DME [facility]   [facility]  HH Arranged:  PT HH Agency:  [facility]  Status of Service:  Completed, signed off  If discussed at Long Length of Stay Meetings, dates discussed:    Additional Comments:  Durenda Guthrie, RN 11/16/2017, 2:49 PM

## 2017-11-16 NOTE — Anesthesia Postprocedure Evaluation (Signed)
Anesthesia Post Note  Patient: Emily Bryan  Procedure(s) Performed: RIGHT HIP PINNING (Right )     Patient location during evaluation: PACU Anesthesia Type: General Level of consciousness: awake and alert Pain management: pain level controlled Vital Signs Assessment: post-procedure vital signs reviewed and stable Respiratory status: spontaneous breathing, nonlabored ventilation and respiratory function stable Cardiovascular status: blood pressure returned to baseline and stable Postop Assessment: no apparent nausea or vomiting Anesthetic complications: no    Last Vitals:  Vitals:   11/15/17 2100 11/16/17 0449  BP: (!) 140/55 (!) 142/63  Pulse: 77 70  Resp: 16 (!) 22  Temp: (!) 36.4 C (!) 36.4 C  SpO2: 100% 100%    Last Pain:  Vitals:   11/16/17 0500  TempSrc:   PainSc: 6                  Lowella Curb

## 2017-11-16 NOTE — Progress Notes (Signed)
Outside imaging disc sent to radiology per MD order for upload to our system.

## 2017-11-16 NOTE — Progress Notes (Signed)
  Echocardiogram 2D Echocardiogram has been performed.  Ganesh Deeg G Ahman Dugdale 11/16/2017, 12:01 PM

## 2017-11-16 NOTE — Plan of Care (Signed)

## 2017-11-17 ENCOUNTER — Other Ambulatory Visit: Payer: Self-pay

## 2017-11-17 LAB — GLUCOSE, CAPILLARY
Glucose-Capillary: 123 mg/dL — ABNORMAL HIGH (ref 65–99)
Glucose-Capillary: 171 mg/dL — ABNORMAL HIGH (ref 65–99)

## 2017-11-17 LAB — CBC
HCT: 33 % — ABNORMAL LOW (ref 36.0–46.0)
Hemoglobin: 10.9 g/dL — ABNORMAL LOW (ref 12.0–15.0)
MCH: 29.3 pg (ref 26.0–34.0)
MCHC: 33 g/dL (ref 30.0–36.0)
MCV: 88.7 fL (ref 78.0–100.0)
PLATELETS: 149 10*3/uL — AB (ref 150–400)
RBC: 3.72 MIL/uL — AB (ref 3.87–5.11)
RDW: 12.7 % (ref 11.5–15.5)
WBC: 11.3 10*3/uL — ABNORMAL HIGH (ref 4.0–10.5)

## 2017-11-17 LAB — BASIC METABOLIC PANEL
Anion gap: 10 (ref 5–15)
BUN: 25 mg/dL — AB (ref 6–20)
CHLORIDE: 104 mmol/L (ref 101–111)
CO2: 28 mmol/L (ref 22–32)
CREATININE: 0.98 mg/dL (ref 0.44–1.00)
Calcium: 8.8 mg/dL — ABNORMAL LOW (ref 8.9–10.3)
GFR calc Af Amer: >60 mL/min (ref >60)
GFR calc non Af Amer: 54 mL/min — ABNORMAL LOW (ref >60)
Glucose, Bld: 141 mg/dL — ABNORMAL HIGH (ref 65–99)
POTASSIUM: 3.8 mmol/L (ref 3.5–5.1)
Sodium: 142 mmol/L (ref 135–145)

## 2017-11-17 LAB — MAGNESIUM: MAGNESIUM: 2.1 mg/dL (ref 1.7–2.4)

## 2017-11-17 MED ORDER — METOPROLOL SUCCINATE ER 100 MG PO TB24: 100.0000 mg | ORAL_TABLET | Freq: Every day | ORAL | 0 refills | Status: DC

## 2017-11-17 MED ORDER — SENNOSIDES-DOCUSATE SODIUM 8.6-50 MG PO TABS: 1.0000 | ORAL_TABLET | Freq: Every day | ORAL | 0 refills | Status: AC

## 2017-11-17 NOTE — Progress Notes (Signed)
Subjective: 2 Days Post-Op Procedure(s) (LRB): RIGHT HIP PINNING (Right) Patient reports pain as mild.    Objective: Vital signs in last 24 hours: Temp:  [98.2 F (36.8 C)-98.4 F (36.9 C)] 98.2 F (36.8 C) (05/04 0450) Pulse Rate:  [60-70] 60 (05/04 0450) Resp:  [14-18] 18 (05/04 0450) BP: (143-162)/(65-66) 143/65 (05/04 0450) SpO2:  [100 %] 100 % (05/04 0450)  Intake/Output from previous day: 05/03 0701 - 05/04 0700 In: 480 [P.O.:480] Out: -  Intake/Output this shift: No intake/output data recorded.  Recent Labs    11/15/17 0040 11/15/17 1719 11/16/17 0850 11/17/17 0512  HGB 12.1 12.1 11.1* 10.9*   Recent Labs    11/16/17 0850 11/17/17 0512  WBC 14.3* 11.3*  RBC 3.94 3.72*  HCT 34.9* 33.0*  PLT 152 149*   Recent Labs    11/16/17 0850 11/17/17 0512  NA 138 142  K 4.2 3.8  CL 102 104  CO2 25 28  BUN 19 25*  CREATININE 1.00 0.98  GLUCOSE 239* 141*  CALCIUM 9.1 8.8*   Recent Labs    11/14/17 2324  INR 1.14    Neurovascular intact Sensation intact distally Intact pulses distally Dorsiflexion/Plantar flexion intact Incision: dressing C/D/I    Assessment/Plan: 2 Days Post-Op Procedure(s) (LRB): RIGHT HIP PINNING (Right) Up with therapy WBAT RLE Keep dsg dry lovenox  daily x30 days dvt proph Pain control as ordered dispo sounds likely may go today defer to med team but she did well with PT and is ready to get home F/u Dr. Renaye Rakers 2 weeks   Margart Sickles 11/17/2017, 7:16 AM

## 2017-11-17 NOTE — Progress Notes (Signed)
Physical Therapy Treatment Patient Details Name: Emily Bryan MRN: [id number] DOB: 1941-03-30 Today's Date: 11/17/2017    History of Present Illness Pt is a 77 y/o female s/p R hip pinning after sustaining a fall. PMH including but not limited to DM and HTN.    PT Comments    Pt making steady progress with functional mobility and participated in stair training this session. Plan per pt is for her to d/c home today. Pt is ready for d/c home from PT perspective.   Pt would continue to benefit from skilled physical therapy services at this time while admitted and after d/c to address the below listed limitations in order to improve overall safety and independence with functional mobility.    Follow Up Recommendations  Home health PT;Supervision/Assistance - 24 hour     Equipment Recommendations  None recommended by PT    Recommendations for Other Services       Precautions / Restrictions Precautions Precautions: Fall Restrictions Weight Bearing Restrictions: Yes RLE Weight Bearing: Weight bearing as tolerated    Mobility  Bed Mobility Overal bed mobility: Needs Assistance Bed Mobility: Supine to Sit;Sit to Supine     Supine to sit: Supervision Sit to supine: Supervision   General bed mobility comments: increased time and effort, supervision for safety  Transfers Overall transfer level: Needs assistance Equipment used: Rolling walker (2 wheeled) Transfers: Sit to/from Stand Sit to Stand: Supervision         General transfer comment: supervision for safety  Ambulation/Gait Ambulation/Gait assistance: Min guard Ambulation Distance (Feet): 75 Feet Assistive device: Rolling walker (2 wheeled) Gait Pattern/deviations: Step-to pattern;Step-through pattern;Decreased step length - right;Decreased step length - left;Decreased stride length;Decreased weight shift to right Gait velocity: decreased Gait velocity interpretation: <1.31 ft/sec, indicative of household  ambulator General Gait Details: pt with mild instability but no overt LOB or need for physical assistance; pt able to tolerate full WB'ing through R LE    Stairs Stairs: Yes Stairs assistance: Min guard Stair Management: Two rails;Step to pattern;Forwards Number of Stairs: 2 General stair comments: cueing for sequencing/technique. pt able to ascend with L LE leading and descend with R LE leading   Wheelchair Mobility    Modified Rankin (Stroke Patients Only)       Balance Overall balance assessment: Needs assistance;History of Falls Sitting-balance support: Feet supported Sitting balance-Leahy Scale: Good     Standing balance support: During functional activity;No upper extremity supported Standing balance-Leahy Scale: Fair                              Cognition Arousal/Alertness: Awake/alert Behavior During Therapy: WFL for tasks assessed/performed Overall Cognitive Status: Within Functional Limits for tasks assessed                                        Exercises      General Comments        Pertinent Vitals/Pain Pain Assessment: No/denies pain    Home Living                      Prior Function            PT Goals (current goals can now be found in the care plan section) Acute Rehab PT Goals PT Goal Formulation: With patient Time For Goal Achievement: 11/30/17 Potential to Achieve  Goals: Good Progress towards PT goals: Progressing toward goals    Frequency    Min 3X/week      PT Plan Current plan remains appropriate    Co-evaluation              AM-PAC PT "6 Clicks" Daily Activity  Outcome Measure  Difficulty turning over in bed (including adjusting bedclothes, sheets and blankets)?: A Little Difficulty moving from lying on back to sitting on the side of the bed? : A Little Difficulty sitting down on and standing up from a chair with arms (e.g., wheelchair, bedside commode, etc,.)?: Unable Help  needed moving to and from a bed to chair (including a wheelchair)?: A Little Help needed walking in hospital room?: A Little Help needed climbing 3-5 steps with a railing? : A Little 6 Click Score: 16    End of Session Equipment Utilized During Treatment: Gait belt Activity Tolerance: Patient tolerated treatment well Patient left: in bed;with call bell/phone within reach Nurse Communication: Mobility status PT Visit Diagnosis: Other abnormalities of gait and mobility (R26.89);Pain Pain - Right/Left: Right Pain - part of body: Hip     Time: 0822-0832 PT Time Calculation (min) (ACUTE ONLY): 10 min  Charges:  $Gait Training: 8-22 mins                    G Codes:       Craig, [place], [place] [phone]    Alessandra Bevels Swayzie Choate 11/17/2017, 9:53 AM

## 2017-11-17 NOTE — Evaluation (Signed)
Occupational Therapy Evaluation Patient Details Name: Emily Bryan MRN: [id number] DOB: Apr 29, 1941 Today's Date: 11/17/2017    History of Present Illness Pt is a 77 y/o female s/p R hip pinning after sustaining a fall. PMH including but not limited to DM and HTN.   Clinical Impression   Pt is functioning at a supervision level in ADL. Educated in tub transfer, use of AE for LB bathing, use of 3 in 1 to elevate toilet, safe footwear and how to transport items safely using her walker. Pt verbalizing understanding of all information. No further OT needs.    Follow Up Recommendations  No OT follow up    Equipment Recommendations  None recommended by OT    Recommendations for Other Services       Precautions / Restrictions Precautions Precautions: Fall Restrictions Weight Bearing Restrictions: Yes RLE Weight Bearing: Weight bearing as tolerated      Mobility Bed Mobility Overal bed mobility: Needs Assistance Bed Mobility: Supine to Sit;Sit to Supine     Supine to sit: Supervision Sit to supine: Supervision   General bed mobility comments: increased time and effort, supervision for safety  Transfers Overall transfer level: Needs assistance Equipment used: Rolling walker (2 wheeled) Transfers: Sit to/from Stand Sit to Stand: Supervision         General transfer comment: supervision for safety    Balance Overall balance assessment: Needs assistance;History of Falls Sitting-balance support: Feet supported Sitting balance-Leahy Scale: Normal     Standing balance support: During functional activity;No upper extremity supported Standing balance-Leahy Scale: Fair Standing balance comment: can release walker to manage pants and stand at sink                           ADL either performed or assessed with clinical judgement   ADL Overall ADL's : Needs assistance/impaired Eating/Feeding: Independent   Grooming: Wash/dry  hands;Standing;Supervision/safety   Upper Body Bathing: Set up;Sitting   Lower Body Bathing: Supervison/ safety;Sit to/from stand Lower Body Bathing Details (indicate cue type and reason): educated in use of long handled bath sponge Upper Body Dressing : Set up;Sitting   Lower Body Dressing: Supervision/safety;Sit to/from stand   Toilet Transfer: Supervision/safety;Ambulation;RW   Toileting- Clothing Manipulation and Hygiene: Supervision/safety;Sit to/from Nurse, children's Details (indicate cue type and reason): educated in tub transfer, avoiding stepping over edge and to have family available to supervise Functional mobility during ADLs: Supervision/safety;Rolling walker       Vision Patient Visual Report: No change from baseline       Perception     Praxis      Pertinent Vitals/Pain Pain Assessment: Faces Faces Pain Scale: Hurts a little bit Pain Location: R hip Pain Descriptors / Indicators: Sore Pain Intervention(s): Monitored during session;Repositioned;Ice applied     Hand Dominance Right   Extremity/Trunk Assessment Upper Extremity Assessment Upper Extremity Assessment: Overall WFL for tasks assessed   Lower Extremity Assessment Lower Extremity Assessment: Defer to PT evaluation   Cervical / Trunk Assessment Cervical / Trunk Assessment: Other exceptions Cervical / Trunk Exceptions: hx of cervical surgery   Communication Communication Communication: No difficulties   Cognition Arousal/Alertness: Awake/alert Behavior During Therapy: WFL for tasks assessed/performed Overall Cognitive Status: Within Functional Limits for tasks assessed  General Comments       Exercises     Shoulder Instructions      Home Living Family/patient expects to be discharged to:: Private residence Living Arrangements: Spouse/significant other;Children Available Help at Discharge: Family;Available 24  hours/day Type of Home: House Home Access: Stairs to enter [employer] of Steps: 3 or 7 Entrance Stairs-Rails: Right;Left Home Layout: One level     Bathroom Shower/Tub: Chief Strategy Officer: Standard     Home Equipment: Cane - single point;Walker - 2 wheels;Shower seat;Walker - 4 wheels;Bedside commode;Hand held shower head          Prior Functioning/Environment Level of Independence: Independent with assistive device(s)        Comments: used SPC when in community        OT Problem List:        OT Treatment/Interventions:      OT Goals(Current goals can be found in the care plan section) Acute Rehab OT Goals Patient Stated Goal: return home, stop falling  OT Frequency:     Barriers to D/C:            Co-evaluation              AM-PAC PT "6 Clicks" Daily Activity     Outcome Measure Help from another person eating meals?: None Help from another person taking care of personal grooming?: A Little Help from another person toileting, which includes using toliet, bedpan, or urinal?: A Little Help from another person bathing (including washing, rinsing, drying)?: A Little Help from another person to put on and taking off regular upper body clothing?: None Help from another person to put on and taking off regular lower body clothing?: A Little 6 Click Score: 20   End of Session Equipment Utilized During Treatment: Rolling walker;Gait belt  Activity Tolerance: Patient tolerated treatment well Patient left: in bed;with call bell/phone within reach;with nursing/sitter in room  OT Visit Diagnosis: Other abnormalities of gait and mobility (R26.89)                Time: 2841-3244 OT Time Calculation (min): 40 min Charges:  OT General Charges $OT Visit: 1 Visit OT Evaluation $OT Eval Low Complexity: 1 Low OT Treatments $Self Care/Home Management : 8-22 mins G-Codes:     12/10/2017 Martie Round, OTR/L Pager: [phone]  Iran Planas,  [place] Bailiff 12/10/2017, 12:23 PM

## 2017-11-17 NOTE — Discharge Summary (Signed)
Discharge Summary  Emily Bryan ZOX:[id number] DOB: May 17, 1941  PCP: Nila Nephew, MD  Admit date: 11/14/2017 Discharge date: 11/17/2017  Time spent: <30mins  Recommendations for Outpatient Follow-up:  1. F/u with PMD within a week  for hospital discharge follow up, repeat cbc/bmp at follow up 2. F/u with ortho Dr Eulah Pont  Discharge Diagnoses:  Active Hospital Problems   Diagnosis Date Noted  . Closed right hip fracture (HCC) 11/14/2017  . Fall 11/15/2017  . Essential hypertension   . Diabetes mellitus without complication (HCC)   . Asthma   . Vertigo     Resolved Hospital Problems  No resolved problems to display.    Discharge Condition: stable  Diet recommendation: heart healthy/carb modified  Filed Weights   11/15/17 1007  Weight: 69 kg (152 lb 1.9 oz)    History of present illness:  PCP: Nila Nephew, MD   Patient coming from:  The patient is coming from home.  At baseline, pt is independent for most of ADL.     Chief Complaint: Right hip pain after fall  HPI: Emily Bryan is a 77 y.o. female with medical history significant of hypertension, diabetes mellitus, asthma, panic attacks, vertigo, chronic neck pain, who presents with right hip pain after fall.  Patient states that she slipped and fell at home after using bathroom yesterday. She developed pain in the left hip, which is severe, sharp, nonradiating.  No leg numbness.  No loss of consciousness.  Patient states that she injured her right face, not neck injury.  At baseline she has ataxic gait instability. She reports mild dizziness, but no unilateral weakness, slurred speech or facial droop.  Patient denies chest pain, shortness breath, nausea, vomiting, diarrhea, abdominal pain, symptoms of UTI.  She is wearing the c-collar since she had C-spine surgery last year.  Patient was initially seen in [facility], and was found to have right hip nondisplaced subcapital femoral neck fracture.  Therefore transferred to [facility] hospital. Dr. Eulah Pont was consulted.  ED Course: pt was found to have WBC 8.8, electrolytes renal function okay, negative urinalysis, temperature 99.2, no tachycardia, no tachypnea, oxygen saturation 100% on room air, negative chest x-ray. CT-head done in [facility] hospital her is negative for acute intracranial abnormalities. Patient is admitted to MedSurg bed as inpatient.     Hospital Course:  Principal Problem:   Closed right hip fracture [facility]) Active Problems:   Essential hypertension   Diabetes mellitus without complication (HCC)   Asthma   Vertigo   Fall   Closed right hip fracture [facility]): -she presented to outside hospital ED after fall at 2am  -s/p RIGHT HIP PINNING on 5/2 -pain control, DVT prophylasix, wound care, activity level per ortho "Weightbearing:WBATRLE Insicional and dressing care:Dressings left intact until follow-up Showering: Keep dressing dry VTE prophylaxis:Lovenox  qd30 days, SCDs, ambulation Pain control:Minimize narcotics if able.Tylenol preferred.Takes hydrocodone chronicallyfor her neck. Oxycodone for breakthrough pain only.  Follow - up plan:Follow up in the office with Dr. Wandra Feinstein in 2 weeks."    Hypomagnesemia: replace mag  HTN:  -Continue home medications:Metoprolol -IV hydralazine prn  Diabetes mellitus without complication [facility]): -Patient is takingmetforminat home. cbg 131. -SSI  A1c 6.1  Asthma: stable. Pt is takingprednisone 5 mg daily. -Dulera inhaler, Spiriva inhaler, as needed albuterol -Continue prednisone, -Solu-Cortef 60 mg x 1 as a stress dose on admission  Sinusitis , flonase  Vertigo:  -prnmeclizine  Ataxia, chronic, h/o neck surgery  Fall: patient seems to have had a mechanical  fall. CT head is negative for acute intracranial abnormalities. -PT/OT     Cardiac Murmur, no chest pain Reports feeling dizzy prior to the fall ekg  unremarkable Echocardiogram lvef wnl,  Aortic valve: Trileaflet; mildly thickened leaflets . Mitral valve: Calcified annulus. Mildly thickened leaflets .Mobility was not restricted. (per chart review patient had an unremarkable nuclear stress test done in 2014)    Code Status: Full  Family Communication: patient   Disposition Plan: home with home health on 5/4   Consultants:  Ortho  Dr Eulah Pont  Procedures: RIGHT HIP PINNING on 5/2    Antibiotics:  Perioperative ancef   Discharge Exam: BP (!) 143/65 (BP Location: Left Arm)   Pulse 60   Temp 98.2 F (36.8 C) (Oral)   Resp 18   Ht 5' 3.5" (1.613 m)   Wt 69 kg (152 lb 1.9 oz)   SpO2 100%   BMI 26.52 kg/m    General:  NAD  Cardiovascular: RRR, + systolic soft murmur at right upper sternal border  Respiratory: CTABL  Abdomen: Soft/ND/NT, positive BS  Musculoskeletal: right hip post op changes, dressing intact  Neuro: alert, oriented    Discharge Instructions You were cared for by a hospitalist during your hospital stay. If you have any questions about your discharge medications or the care you received while you were in the hospital after you are discharged, you can call the unit and asked to speak with the hospitalist on call if the hospitalist that took care of you is not available. Once you are discharged, your primary care physician will handle any further medical issues. Please note that NO REFILLS for any discharge medications will be authorized once you are discharged, as it is imperative that you return to your primary care physician (or establish a relationship with a primary care physician if you do not have one) for your aftercare needs so that they can reassess your need for medications and monitor your lab values.  Discharge Instructions    Diet - low sodium heart healthy   Complete by:  As directed    Carb modified.   Increase activity slowly   Complete by:  As directed       Allergies as of 11/17/2017      Reactions   Asa [aspirin] Other (See Comments)   GI Upset any higher than    Erythromycin Other (See Comments)   Severe GI Upset   Sulfa Antibiotics Itching, Rash      Medication List    STOP taking these medications   metoprolol tartrate 100 MG tablet Commonly known as:  LOPRESSOR     TAKE these medications   acetaminophen 500 MG tablet Commonly known as:  TYLENOL Take 2 tablets (1,000 mg total) by mouth every 8 (eight) hours for 14 days. For Pain.   budesonide-formoterol 160-4.5 MCG/ACT inhaler Commonly known as:  SYMBICORT Inhale 2 puffs into the lungs 2 (two) times daily.   cetirizine 10 MG tablet Commonly known as:  ZYRTEC Take 10 mg by mouth daily.   diazepam 5 MG tablet Commonly known as:  VALIUM Take 5 mg by mouth 3 (three) times daily as needed (for dizziness if meclizine does not provide relief).   enoxaparin 40 MG/0.4ML injection Commonly known as:  LOVENOX Inject 0.4 mLs (40 mg total) into the skin daily for 30 doses. For 30 days post op for DVT prophylaxis   gabapentin 100 MG capsule Commonly known as:  NEURONTIN Take 100-200 mg by mouth See admin  instructions. Take one capsule ( ) in the morning and at midday, then take two capsules ( ) at bedtime.   HYDROcodone-acetaminophen 5-325 MG tablet Commonly known as:  NORCO/VICODIN Take 1-2 tablets by mouth every 6 (six) hours as needed for pain.   meclizine 25 MG tablet Commonly known as:  ANTIVERT Take 25 mg by mouth 3 (three) times daily as needed for dizziness.   metFORMIN 500 MG tablet Commonly known as:  GLUCOPHAGE Take 500 mg by mouth daily with breakfast.   metoprolol succinate 100 MG 24 hr tablet Commonly known as:  TOPROL-XL Take 1 tablet (100 mg total) by mouth daily. Take with or immediately following a meal. Start taking on:  11/18/2017   montelukast 10 MG tablet Commonly known as:  SINGULAIR Take 10 mg by mouth daily.   ondansetron 4 MG  tablet Commonly known as:  ZOFRAN Take 1 tablet (4 mg total) by mouth every 8 (eight) hours as needed for nausea or vomiting.   oxyCODONE 5 MG immediate release tablet Commonly known as:  ROXICODONE Take 1 tablet (5 mg total) by mouth every 8 (eight) hours as needed for up to 7 days for breakthrough pain (Breakthrough Pain Only.  Utilize Tylenol or chronic pain medicine (hydrocodone) first.).   potassium chloride SA 20 MEQ tablet Commonly known as:  K-DUR,KLOR-CON Take 20 mEq by mouth 2 (two) times daily.   predniSONE 5 MG tablet Commonly known as:  DELTASONE Take 5 mg by mouth daily.   senna-docusate 8.6-50 MG tablet Commonly known as:  Senokot-S Take 1 tablet by mouth at bedtime for 10 days.   tiotropium 18 MCG inhalation capsule Commonly known as:  SPIRIVA Place 18 mcg into inhaler and inhale daily as needed (wheezing or shortness of breath).   triazolam 0.25 MG tablet Commonly known as:  HALCION Take 0.25 mg by mouth at bedtime as needed for sleep.            Durable Medical Equipment  (From admission, onward)        Start     Ordered   11/16/17 1457  For home use only DME 3 n 1  Once     11/16/17 1457     Allergies  Allergen Reactions  . Asa [Aspirin] Other (See Comments)    GI Upset any higher than   . Erythromycin Other (See Comments)    Severe GI Upset  . Sulfa Antibiotics Itching and Rash   Follow-up Information    Sheral Apley, MD In 2 weeks.   Specialty:  Orthopedic Surgery Contact information: [address] [place] [address] [phone]        Nila Nephew, MD Follow up in 1 week(s).   Specialty:  Internal Medicine Why:  hospital discharge follow up. Contact information: [address] [place] [address] [phone]            The results of significant diagnostics from this hospitalization (including imaging, microbiology, ancillary and laboratory) are listed below for reference.     Significant Diagnostic Studies: Ct Head Wo Contrast  Result Date: 11/15/2017 CLINICAL DATA:  Head trauma and ataxia EXAM: CT HEAD WITHOUT CONTRAST TECHNIQUE: Contiguous axial images were obtained from the base of the skull through the vertex without intravenous contrast. COMPARISON:  Sinus CT 06/23/2016 FINDINGS: Brain: No mass lesion, intraparenchymal hemorrhage or extra-axial collection. No evidence of acute cortical infarct. Normal appearance of the brain parenchyma and extra axial spaces for age. Vascular: No hyperdense  vessel or unexpected vascular calcification. Skull: Normal visualized skull base, calvarium and extracranial soft tissues. Sinuses/Orbits: Complete opacification of the left frontal, left anterior ethmoid and left maxillary sinuses. Normal orbits. IMPRESSION: 1. Normal aging brain. 2. Left ostiomeatal complex pattern chronic sinusitis. Electronically Signed   By: Deatra Robinson M.D.   On: 11/15/2017 05:23   Dg C-arm 1-60 Min  Result Date: 11/15/2017 CLINICAL DATA:  Screw fixation for fracture EXAM: OPERATIVE RIGHT HIP  3 VIEWS TECHNIQUE: Fluoroscopic spot image(s) were submitted for interpretation post-operatively. FLUOROSCOPY TIME:  0 minutes 21 seconds; 3 acquired images COMPARISON:  Pelvis and right hip Nov 14, 2017. FINDINGS: Frontal, oblique, and lateral images obtained. There are 3 screws transfixing the subcapital femoral neck with alignment anatomic in this area. Tips of the screws are in the femoral head. No new fracture. No dislocation. Right hip joint appears unremarkable. IMPRESSION: Three screws transfix the subcapital femoral neck region on the right with alignment anatomic. Tips of screws in proximal femoral head. No dislocation. No new fracture. Electronically Signed   By: Bretta Bang III M.D.   On: 11/15/2017 15:41   Dg Hip Operative Unilat W Or W/o Pelvis Right  Result Date: 11/15/2017 CLINICAL DATA:  Screw fixation for fracture EXAM: OPERATIVE RIGHT HIP  3 VIEWS  TECHNIQUE: Fluoroscopic spot image(s) were submitted for interpretation post-operatively. FLUOROSCOPY TIME:  0 minutes 21 seconds; 3 acquired images COMPARISON:  Pelvis and right hip Nov 14, 2017. FINDINGS: Frontal, oblique, and lateral images obtained. There are 3 screws transfixing the subcapital femoral neck with alignment anatomic in this area. Tips of the screws are in the femoral head. No new fracture. No dislocation. Right hip joint appears unremarkable. IMPRESSION: Three screws transfix the subcapital femoral neck region on the right with alignment anatomic. Tips of screws in proximal femoral head. No dislocation. No new fracture. Electronically Signed   By: Bretta Bang III M.D.   On: 11/15/2017 15:41    Microbiology: Recent Results (from the past 240 hour(s))  Surgical pcr screen     Status: None   Collection Time: 11/15/17  5:38 AM  Result Value Ref Range Status   MRSA, PCR NEGATIVE NEGATIVE Final   Staphylococcus aureus NEGATIVE NEGATIVE Final    Comment: (NOTE) The Xpert SA Assay (FDA approved for NASAL specimens in patients 12 years of age and older), is one component of a comprehensive surveillance program. It is not intended to diagnose infection nor to guide or monitor treatment. Performed at [facility], [address] [place], [place] [address]      Labs: Basic Metabolic Panel: Recent Labs  Lab 11/15/17 0040 11/15/17 1719 11/16/17 0850 11/17/17 0512  NA 141  --  138 142  K 3.8  --  4.2 3.8  CL 105  --  102 104  CO2 28  --  25 28  GLUCOSE 119*  --  239* 141*  BUN 11  --  19 25*  CREATININE 0.90 0.84 1.00 0.98  CALCIUM 8.8*  --  9.1 8.8*  MG  --   --  1.6* 2.1   Liver Function Tests: No results for input(s): AST, ALT, ALKPHOS, BILITOT, PROT, ALBUMIN in the last 168 hours. No results for input(s): LIPASE, AMYLASE in the last 168 hours. No results for input(s): AMMONIA in the last 168 hours. CBC: Recent Labs  Lab 11/15/17 0040 11/15/17 1719  11/16/17 0850 11/17/17 0512  WBC 10.6* 12.1* 14.3* 11.3*  HGB 12.1 12.1 11.1* 10.9*  HCT  38.1 37.7 34.9* 33.0*  MCV 90.3 88.7 88.6 88.7  PLT 174 163 152 149*   Cardiac Enzymes: No results for input(s): CKTOTAL, CKMB, CKMBINDEX, TROPONINI in the last 168 hours. BNP: BNP (last 3 results) No results for input(s): BNP in the last 8760 hours.  ProBNP (last 3 results) No results for input(s): PROBNP in the last 8760 hours.  CBG: Recent Labs  Lab 11/16/17 0615 11/16/17 1223 11/16/17 1653 11/16/17 2127 11/17/17 0634  GLUCAP 192* 165* 203* 228* 123*       Signed:  Albertine Grates MD, PhD  [facility] 11/17/2017, 9:40 AM

## 2019-09-01 DIAGNOSIS — Z1231 Encounter for screening mammogram for malignant neoplasm of breast: Secondary | ICD-10-CM | POA: Diagnosis not present

## 2019-12-19 DIAGNOSIS — R2681 Unsteadiness on feet: Secondary | ICD-10-CM | POA: Diagnosis not present

## 2019-12-19 DIAGNOSIS — I10 Essential (primary) hypertension: Secondary | ICD-10-CM | POA: Diagnosis not present

## 2019-12-19 DIAGNOSIS — Z789 Other specified health status: Secondary | ICD-10-CM | POA: Diagnosis not present

## 2019-12-19 DIAGNOSIS — J45909 Unspecified asthma, uncomplicated: Secondary | ICD-10-CM | POA: Diagnosis not present

## 2019-12-19 DIAGNOSIS — E1142 Type 2 diabetes mellitus with diabetic polyneuropathy: Secondary | ICD-10-CM | POA: Diagnosis not present

## 2019-12-19 DIAGNOSIS — E1165 Type 2 diabetes mellitus with hyperglycemia: Secondary | ICD-10-CM | POA: Diagnosis not present

## 2019-12-19 DIAGNOSIS — Z299 Encounter for prophylactic measures, unspecified: Secondary | ICD-10-CM | POA: Diagnosis not present

## 2019-12-23 DIAGNOSIS — H04123 Dry eye syndrome of bilateral lacrimal glands: Secondary | ICD-10-CM | POA: Diagnosis not present

## 2019-12-23 DIAGNOSIS — E119 Type 2 diabetes mellitus without complications: Secondary | ICD-10-CM | POA: Diagnosis not present

## 2019-12-23 DIAGNOSIS — Z961 Presence of intraocular lens: Secondary | ICD-10-CM | POA: Diagnosis not present

## 2019-12-23 DIAGNOSIS — H40013 Open angle with borderline findings, low risk, bilateral: Secondary | ICD-10-CM | POA: Diagnosis not present

## 2020-01-05 DIAGNOSIS — E1142 Type 2 diabetes mellitus with diabetic polyneuropathy: Secondary | ICD-10-CM | POA: Diagnosis not present

## 2020-01-05 DIAGNOSIS — I10 Essential (primary) hypertension: Secondary | ICD-10-CM | POA: Diagnosis not present

## 2020-01-05 DIAGNOSIS — E1165 Type 2 diabetes mellitus with hyperglycemia: Secondary | ICD-10-CM | POA: Diagnosis not present

## 2020-01-05 DIAGNOSIS — Z299 Encounter for prophylactic measures, unspecified: Secondary | ICD-10-CM | POA: Diagnosis not present

## 2020-01-05 DIAGNOSIS — J45909 Unspecified asthma, uncomplicated: Secondary | ICD-10-CM | POA: Diagnosis not present

## 2020-01-06 DIAGNOSIS — I10 Essential (primary) hypertension: Secondary | ICD-10-CM | POA: Diagnosis not present

## 2020-03-19 DIAGNOSIS — Z0000 Encounter for general adult medical examination without abnormal findings: Secondary | ICD-10-CM | POA: Diagnosis not present

## 2020-03-19 DIAGNOSIS — Z7189 Other specified counseling: Secondary | ICD-10-CM | POA: Diagnosis not present

## 2020-03-19 DIAGNOSIS — E1165 Type 2 diabetes mellitus with hyperglycemia: Secondary | ICD-10-CM | POA: Diagnosis not present

## 2020-03-19 DIAGNOSIS — Z299 Encounter for prophylactic measures, unspecified: Secondary | ICD-10-CM | POA: Diagnosis not present

## 2020-03-30 DIAGNOSIS — I10 Essential (primary) hypertension: Secondary | ICD-10-CM | POA: Diagnosis not present

## 2020-03-30 DIAGNOSIS — M542 Cervicalgia: Secondary | ICD-10-CM | POA: Diagnosis not present

## 2020-03-30 DIAGNOSIS — Z299 Encounter for prophylactic measures, unspecified: Secondary | ICD-10-CM | POA: Diagnosis not present

## 2020-03-30 DIAGNOSIS — E1165 Type 2 diabetes mellitus with hyperglycemia: Secondary | ICD-10-CM | POA: Diagnosis not present

## 2020-04-13 DIAGNOSIS — M542 Cervicalgia: Secondary | ICD-10-CM | POA: Diagnosis not present

## 2020-04-23 DIAGNOSIS — E1165 Type 2 diabetes mellitus with hyperglycemia: Secondary | ICD-10-CM | POA: Diagnosis not present

## 2020-04-23 DIAGNOSIS — Z299 Encounter for prophylactic measures, unspecified: Secondary | ICD-10-CM | POA: Diagnosis not present

## 2020-04-23 DIAGNOSIS — E1142 Type 2 diabetes mellitus with diabetic polyneuropathy: Secondary | ICD-10-CM | POA: Diagnosis not present

## 2020-04-23 DIAGNOSIS — I10 Essential (primary) hypertension: Secondary | ICD-10-CM | POA: Diagnosis not present

## 2020-06-23 DIAGNOSIS — M25552 Pain in left hip: Secondary | ICD-10-CM | POA: Diagnosis not present

## 2020-06-23 DIAGNOSIS — M25551 Pain in right hip: Secondary | ICD-10-CM | POA: Diagnosis not present

## 2020-06-23 DIAGNOSIS — M47816 Spondylosis without myelopathy or radiculopathy, lumbar region: Secondary | ICD-10-CM | POA: Diagnosis not present

## 2020-06-23 DIAGNOSIS — M5136 Other intervertebral disc degeneration, lumbar region: Secondary | ICD-10-CM | POA: Diagnosis not present

## 2020-06-23 DIAGNOSIS — M47817 Spondylosis without myelopathy or radiculopathy, lumbosacral region: Secondary | ICD-10-CM | POA: Diagnosis not present

## 2020-06-23 DIAGNOSIS — I700 Atherosclerosis of aorta: Secondary | ICD-10-CM | POA: Diagnosis not present

## 2020-07-19 DIAGNOSIS — Z23 Encounter for immunization: Secondary | ICD-10-CM | POA: Diagnosis not present

## 2020-07-19 DIAGNOSIS — Z789 Other specified health status: Secondary | ICD-10-CM | POA: Diagnosis not present

## 2020-07-19 DIAGNOSIS — I10 Essential (primary) hypertension: Secondary | ICD-10-CM | POA: Diagnosis not present

## 2020-07-19 DIAGNOSIS — E1165 Type 2 diabetes mellitus with hyperglycemia: Secondary | ICD-10-CM | POA: Diagnosis not present

## 2020-07-19 DIAGNOSIS — Z299 Encounter for prophylactic measures, unspecified: Secondary | ICD-10-CM | POA: Diagnosis not present

## 2020-07-19 DIAGNOSIS — G2581 Restless legs syndrome: Secondary | ICD-10-CM | POA: Diagnosis not present

## 2020-08-16 DIAGNOSIS — G2581 Restless legs syndrome: Secondary | ICD-10-CM | POA: Diagnosis not present

## 2020-08-16 DIAGNOSIS — E1142 Type 2 diabetes mellitus with diabetic polyneuropathy: Secondary | ICD-10-CM | POA: Diagnosis not present

## 2020-08-16 DIAGNOSIS — J45909 Unspecified asthma, uncomplicated: Secondary | ICD-10-CM | POA: Diagnosis not present

## 2020-08-16 DIAGNOSIS — I10 Essential (primary) hypertension: Secondary | ICD-10-CM | POA: Diagnosis not present

## 2020-10-27 DIAGNOSIS — R42 Dizziness and giddiness: Secondary | ICD-10-CM | POA: Diagnosis not present

## 2020-10-27 DIAGNOSIS — E1142 Type 2 diabetes mellitus with diabetic polyneuropathy: Secondary | ICD-10-CM | POA: Diagnosis not present

## 2020-10-27 DIAGNOSIS — I10 Essential (primary) hypertension: Secondary | ICD-10-CM | POA: Diagnosis not present

## 2020-10-27 DIAGNOSIS — Z299 Encounter for prophylactic measures, unspecified: Secondary | ICD-10-CM | POA: Diagnosis not present

## 2020-10-27 DIAGNOSIS — E1165 Type 2 diabetes mellitus with hyperglycemia: Secondary | ICD-10-CM | POA: Diagnosis not present

## 2020-11-13 DIAGNOSIS — E1142 Type 2 diabetes mellitus with diabetic polyneuropathy: Secondary | ICD-10-CM | POA: Diagnosis not present

## 2020-11-13 DIAGNOSIS — J45909 Unspecified asthma, uncomplicated: Secondary | ICD-10-CM | POA: Diagnosis not present

## 2020-11-13 DIAGNOSIS — I10 Essential (primary) hypertension: Secondary | ICD-10-CM | POA: Diagnosis not present

## 2020-11-13 DIAGNOSIS — G2581 Restless legs syndrome: Secondary | ICD-10-CM | POA: Diagnosis not present

## 2020-12-13 DIAGNOSIS — G2581 Restless legs syndrome: Secondary | ICD-10-CM | POA: Diagnosis not present

## 2020-12-13 DIAGNOSIS — E1142 Type 2 diabetes mellitus with diabetic polyneuropathy: Secondary | ICD-10-CM | POA: Diagnosis not present

## 2020-12-13 DIAGNOSIS — J45909 Unspecified asthma, uncomplicated: Secondary | ICD-10-CM | POA: Diagnosis not present

## 2020-12-13 DIAGNOSIS — I10 Essential (primary) hypertension: Secondary | ICD-10-CM | POA: Diagnosis not present

## 2021-01-13 DIAGNOSIS — G2581 Restless legs syndrome: Secondary | ICD-10-CM | POA: Diagnosis not present

## 2021-01-13 DIAGNOSIS — E1142 Type 2 diabetes mellitus with diabetic polyneuropathy: Secondary | ICD-10-CM | POA: Diagnosis not present

## 2021-01-13 DIAGNOSIS — J45909 Unspecified asthma, uncomplicated: Secondary | ICD-10-CM | POA: Diagnosis not present

## 2021-01-13 DIAGNOSIS — I10 Essential (primary) hypertension: Secondary | ICD-10-CM | POA: Diagnosis not present

## 2021-02-07 DIAGNOSIS — G2581 Restless legs syndrome: Secondary | ICD-10-CM | POA: Diagnosis not present

## 2021-02-07 DIAGNOSIS — E1142 Type 2 diabetes mellitus with diabetic polyneuropathy: Secondary | ICD-10-CM | POA: Diagnosis not present

## 2021-02-07 DIAGNOSIS — E1165 Type 2 diabetes mellitus with hyperglycemia: Secondary | ICD-10-CM | POA: Diagnosis not present

## 2021-02-07 DIAGNOSIS — I10 Essential (primary) hypertension: Secondary | ICD-10-CM | POA: Diagnosis not present

## 2021-02-07 DIAGNOSIS — Z299 Encounter for prophylactic measures, unspecified: Secondary | ICD-10-CM | POA: Diagnosis not present

## 2021-02-13 DIAGNOSIS — J45909 Unspecified asthma, uncomplicated: Secondary | ICD-10-CM | POA: Diagnosis not present

## 2021-02-13 DIAGNOSIS — G2581 Restless legs syndrome: Secondary | ICD-10-CM | POA: Diagnosis not present

## 2021-02-13 DIAGNOSIS — E1142 Type 2 diabetes mellitus with diabetic polyneuropathy: Secondary | ICD-10-CM | POA: Diagnosis not present

## 2021-02-13 DIAGNOSIS — I10 Essential (primary) hypertension: Secondary | ICD-10-CM | POA: Diagnosis not present

## 2021-03-25 DIAGNOSIS — E7800 Pure hypercholesterolemia, unspecified: Secondary | ICD-10-CM | POA: Diagnosis not present

## 2021-03-25 DIAGNOSIS — Z0000 Encounter for general adult medical examination without abnormal findings: Secondary | ICD-10-CM | POA: Diagnosis not present

## 2021-03-25 DIAGNOSIS — R5383 Other fatigue: Secondary | ICD-10-CM | POA: Diagnosis not present

## 2021-03-25 DIAGNOSIS — Z7189 Other specified counseling: Secondary | ICD-10-CM | POA: Diagnosis not present

## 2021-03-25 DIAGNOSIS — Z23 Encounter for immunization: Secondary | ICD-10-CM | POA: Diagnosis not present

## 2021-03-25 DIAGNOSIS — E559 Vitamin D deficiency, unspecified: Secondary | ICD-10-CM | POA: Diagnosis not present

## 2021-03-25 DIAGNOSIS — Z79899 Other long term (current) drug therapy: Secondary | ICD-10-CM | POA: Diagnosis not present

## 2021-03-25 DIAGNOSIS — Z299 Encounter for prophylactic measures, unspecified: Secondary | ICD-10-CM | POA: Diagnosis not present

## 2021-03-25 DIAGNOSIS — I10 Essential (primary) hypertension: Secondary | ICD-10-CM | POA: Diagnosis not present

## 2021-04-15 DIAGNOSIS — G2581 Restless legs syndrome: Secondary | ICD-10-CM | POA: Diagnosis not present

## 2021-04-15 DIAGNOSIS — E1142 Type 2 diabetes mellitus with diabetic polyneuropathy: Secondary | ICD-10-CM | POA: Diagnosis not present

## 2021-06-15 DIAGNOSIS — I10 Essential (primary) hypertension: Secondary | ICD-10-CM | POA: Diagnosis not present

## 2021-06-15 DIAGNOSIS — G2581 Restless legs syndrome: Secondary | ICD-10-CM | POA: Diagnosis not present

## 2021-06-15 DIAGNOSIS — E1142 Type 2 diabetes mellitus with diabetic polyneuropathy: Secondary | ICD-10-CM | POA: Diagnosis not present

## 2021-06-23 DIAGNOSIS — H04123 Dry eye syndrome of bilateral lacrimal glands: Secondary | ICD-10-CM | POA: Diagnosis not present

## 2021-06-23 DIAGNOSIS — H40013 Open angle with borderline findings, low risk, bilateral: Secondary | ICD-10-CM | POA: Diagnosis not present

## 2021-06-23 DIAGNOSIS — E119 Type 2 diabetes mellitus without complications: Secondary | ICD-10-CM | POA: Diagnosis not present

## 2021-06-23 DIAGNOSIS — Z961 Presence of intraocular lens: Secondary | ICD-10-CM | POA: Diagnosis not present

## 2021-07-15 DIAGNOSIS — G2581 Restless legs syndrome: Secondary | ICD-10-CM | POA: Diagnosis not present

## 2021-07-15 DIAGNOSIS — I10 Essential (primary) hypertension: Secondary | ICD-10-CM | POA: Diagnosis not present

## 2021-08-16 DIAGNOSIS — G2581 Restless legs syndrome: Secondary | ICD-10-CM | POA: Diagnosis not present

## 2021-08-16 DIAGNOSIS — I10 Essential (primary) hypertension: Secondary | ICD-10-CM | POA: Diagnosis not present

## 2021-08-16 DIAGNOSIS — J45909 Unspecified asthma, uncomplicated: Secondary | ICD-10-CM | POA: Diagnosis not present

## 2021-08-26 DIAGNOSIS — K580 Irritable bowel syndrome with diarrhea: Secondary | ICD-10-CM | POA: Diagnosis not present

## 2021-08-26 DIAGNOSIS — E1142 Type 2 diabetes mellitus with diabetic polyneuropathy: Secondary | ICD-10-CM | POA: Diagnosis not present

## 2021-08-26 DIAGNOSIS — I10 Essential (primary) hypertension: Secondary | ICD-10-CM | POA: Diagnosis not present

## 2021-08-26 DIAGNOSIS — Z299 Encounter for prophylactic measures, unspecified: Secondary | ICD-10-CM | POA: Diagnosis not present

## 2021-08-26 DIAGNOSIS — E1165 Type 2 diabetes mellitus with hyperglycemia: Secondary | ICD-10-CM | POA: Diagnosis not present

## 2021-08-26 DIAGNOSIS — Z789 Other specified health status: Secondary | ICD-10-CM | POA: Diagnosis not present

## 2021-09-12 DIAGNOSIS — K589 Irritable bowel syndrome without diarrhea: Secondary | ICD-10-CM | POA: Diagnosis not present

## 2021-09-12 DIAGNOSIS — Z299 Encounter for prophylactic measures, unspecified: Secondary | ICD-10-CM | POA: Diagnosis not present

## 2021-09-12 DIAGNOSIS — E559 Vitamin D deficiency, unspecified: Secondary | ICD-10-CM | POA: Diagnosis not present

## 2021-09-12 DIAGNOSIS — R5383 Other fatigue: Secondary | ICD-10-CM | POA: Diagnosis not present

## 2021-09-12 DIAGNOSIS — I10 Essential (primary) hypertension: Secondary | ICD-10-CM | POA: Diagnosis not present

## 2021-09-12 DIAGNOSIS — R001 Bradycardia, unspecified: Secondary | ICD-10-CM | POA: Diagnosis not present

## 2021-09-29 DIAGNOSIS — M160 Bilateral primary osteoarthritis of hip: Secondary | ICD-10-CM | POA: Diagnosis not present

## 2021-09-29 DIAGNOSIS — M25552 Pain in left hip: Secondary | ICD-10-CM | POA: Diagnosis not present

## 2021-09-29 DIAGNOSIS — M1612 Unilateral primary osteoarthritis, left hip: Secondary | ICD-10-CM | POA: Diagnosis not present

## 2021-09-29 DIAGNOSIS — R5383 Other fatigue: Secondary | ICD-10-CM | POA: Diagnosis not present

## 2021-09-29 DIAGNOSIS — E1165 Type 2 diabetes mellitus with hyperglycemia: Secondary | ICD-10-CM | POA: Diagnosis not present

## 2021-09-29 DIAGNOSIS — I10 Essential (primary) hypertension: Secondary | ICD-10-CM | POA: Diagnosis not present

## 2021-09-29 DIAGNOSIS — J45909 Unspecified asthma, uncomplicated: Secondary | ICD-10-CM | POA: Diagnosis not present

## 2021-09-29 DIAGNOSIS — Z789 Other specified health status: Secondary | ICD-10-CM | POA: Diagnosis not present

## 2021-09-29 DIAGNOSIS — Z299 Encounter for prophylactic measures, unspecified: Secondary | ICD-10-CM | POA: Diagnosis not present

## 2021-09-29 DIAGNOSIS — E559 Vitamin D deficiency, unspecified: Secondary | ICD-10-CM | POA: Diagnosis not present

## 2022-01-13 DIAGNOSIS — E1142 Type 2 diabetes mellitus with diabetic polyneuropathy: Secondary | ICD-10-CM | POA: Diagnosis not present

## 2022-01-13 DIAGNOSIS — I10 Essential (primary) hypertension: Secondary | ICD-10-CM | POA: Diagnosis not present

## 2022-01-13 DIAGNOSIS — J449 Chronic obstructive pulmonary disease, unspecified: Secondary | ICD-10-CM | POA: Diagnosis not present

## 2022-01-13 DIAGNOSIS — G2581 Restless legs syndrome: Secondary | ICD-10-CM | POA: Diagnosis not present

## 2022-01-28 DIAGNOSIS — Z7984 Long term (current) use of oral hypoglycemic drugs: Secondary | ICD-10-CM | POA: Diagnosis not present

## 2022-01-28 DIAGNOSIS — R0781 Pleurodynia: Secondary | ICD-10-CM | POA: Diagnosis not present

## 2022-01-28 DIAGNOSIS — M25552 Pain in left hip: Secondary | ICD-10-CM | POA: Diagnosis not present

## 2022-01-28 DIAGNOSIS — E119 Type 2 diabetes mellitus without complications: Secondary | ICD-10-CM | POA: Diagnosis not present

## 2022-01-28 DIAGNOSIS — W1839XA Other fall on same level, initial encounter: Secondary | ICD-10-CM | POA: Diagnosis not present

## 2022-01-28 DIAGNOSIS — I10 Essential (primary) hypertension: Secondary | ICD-10-CM | POA: Diagnosis not present

## 2022-01-28 DIAGNOSIS — R0789 Other chest pain: Secondary | ICD-10-CM | POA: Diagnosis not present

## 2022-01-28 DIAGNOSIS — M419 Scoliosis, unspecified: Secondary | ICD-10-CM | POA: Diagnosis not present

## 2022-01-31 DIAGNOSIS — N1831 Chronic kidney disease, stage 3a: Secondary | ICD-10-CM | POA: Diagnosis not present

## 2022-01-31 DIAGNOSIS — E1165 Type 2 diabetes mellitus with hyperglycemia: Secondary | ICD-10-CM | POA: Diagnosis not present

## 2022-01-31 DIAGNOSIS — E1142 Type 2 diabetes mellitus with diabetic polyneuropathy: Secondary | ICD-10-CM | POA: Diagnosis not present

## 2022-01-31 DIAGNOSIS — Z299 Encounter for prophylactic measures, unspecified: Secondary | ICD-10-CM | POA: Diagnosis not present

## 2022-01-31 DIAGNOSIS — I10 Essential (primary) hypertension: Secondary | ICD-10-CM | POA: Diagnosis not present

## 2022-02-27 DIAGNOSIS — R002 Palpitations: Secondary | ICD-10-CM | POA: Diagnosis not present

## 2022-03-14 DIAGNOSIS — R002 Palpitations: Secondary | ICD-10-CM | POA: Diagnosis not present

## 2022-03-28 DIAGNOSIS — Z0000 Encounter for general adult medical examination without abnormal findings: Secondary | ICD-10-CM | POA: Diagnosis not present

## 2022-03-28 DIAGNOSIS — Z23 Encounter for immunization: Secondary | ICD-10-CM | POA: Diagnosis not present

## 2022-03-28 DIAGNOSIS — E559 Vitamin D deficiency, unspecified: Secondary | ICD-10-CM | POA: Diagnosis not present

## 2022-03-28 DIAGNOSIS — R5383 Other fatigue: Secondary | ICD-10-CM | POA: Diagnosis not present

## 2022-03-28 DIAGNOSIS — Z79899 Other long term (current) drug therapy: Secondary | ICD-10-CM | POA: Diagnosis not present

## 2022-03-28 DIAGNOSIS — E1165 Type 2 diabetes mellitus with hyperglycemia: Secondary | ICD-10-CM | POA: Diagnosis not present

## 2022-03-28 DIAGNOSIS — Z299 Encounter for prophylactic measures, unspecified: Secondary | ICD-10-CM | POA: Diagnosis not present

## 2022-03-28 DIAGNOSIS — I10 Essential (primary) hypertension: Secondary | ICD-10-CM | POA: Diagnosis not present

## 2022-05-25 DIAGNOSIS — E1142 Type 2 diabetes mellitus with diabetic polyneuropathy: Secondary | ICD-10-CM | POA: Diagnosis not present

## 2022-05-25 DIAGNOSIS — Z0000 Encounter for general adult medical examination without abnormal findings: Secondary | ICD-10-CM | POA: Diagnosis not present

## 2022-05-25 DIAGNOSIS — Z789 Other specified health status: Secondary | ICD-10-CM | POA: Diagnosis not present

## 2022-05-25 DIAGNOSIS — Z299 Encounter for prophylactic measures, unspecified: Secondary | ICD-10-CM | POA: Diagnosis not present

## 2022-05-25 DIAGNOSIS — N1831 Chronic kidney disease, stage 3a: Secondary | ICD-10-CM | POA: Diagnosis not present

## 2022-05-25 DIAGNOSIS — Z7189 Other specified counseling: Secondary | ICD-10-CM | POA: Diagnosis not present

## 2022-05-25 DIAGNOSIS — I10 Essential (primary) hypertension: Secondary | ICD-10-CM | POA: Diagnosis not present

## 2022-05-25 DIAGNOSIS — R42 Dizziness and giddiness: Secondary | ICD-10-CM | POA: Diagnosis not present

## 2022-06-02 DIAGNOSIS — I480 Paroxysmal atrial fibrillation: Secondary | ICD-10-CM | POA: Diagnosis not present

## 2022-06-02 DIAGNOSIS — E1165 Type 2 diabetes mellitus with hyperglycemia: Secondary | ICD-10-CM | POA: Diagnosis not present

## 2022-06-02 DIAGNOSIS — E1142 Type 2 diabetes mellitus with diabetic polyneuropathy: Secondary | ICD-10-CM | POA: Diagnosis not present

## 2022-06-02 DIAGNOSIS — I10 Essential (primary) hypertension: Secondary | ICD-10-CM | POA: Diagnosis not present

## 2022-06-02 DIAGNOSIS — Z299 Encounter for prophylactic measures, unspecified: Secondary | ICD-10-CM | POA: Diagnosis not present

## 2022-07-19 ENCOUNTER — Ambulatory Visit: Payer: PRIVATE HEALTH INSURANCE | Admitting: Internal Medicine

## 2022-08-10 ENCOUNTER — Ambulatory Visit: Payer: Medicare Other | Attending: Internal Medicine | Admitting: Internal Medicine

## 2022-08-10 ENCOUNTER — Encounter: Payer: Self-pay | Admitting: *Deleted

## 2022-08-10 ENCOUNTER — Encounter: Payer: Self-pay | Admitting: Internal Medicine

## 2022-08-10 VITALS — BP 138/72 | HR 68 | Ht 63.0 in | Wt 156.2 lb

## 2022-08-10 DIAGNOSIS — I340 Nonrheumatic mitral (valve) insufficiency: Secondary | ICD-10-CM | POA: Insufficient documentation

## 2022-08-10 DIAGNOSIS — I10 Essential (primary) hypertension: Secondary | ICD-10-CM

## 2022-08-10 DIAGNOSIS — M7989 Other specified soft tissue disorders: Secondary | ICD-10-CM | POA: Diagnosis not present

## 2022-08-10 MED ORDER — FUROSEMIDE 20 MG PO TABS: 20.0000 mg | ORAL_TABLET | Freq: Every day | ORAL | 1 refills | Status: DC

## 2022-08-10 NOTE — Progress Notes (Signed)
Cardiology Office Note  Date: 08/10/2022   ID: Emily Bryan, DOB Apr 09, 1941, MRN [id number]  PCP:  Levin Erp, MD  Cardiologist:  Chalmers Guest, MD Electrophysiologist:  None   Reason for Office Visit: Paroxysmal Afib evaluation  History of Present Illness: Emily Bryan is a 82 y.o. female known to have HTN, DM 2, asthma was referred to cardiology clinic for evaluation of paroxysmal atrial fibrillation. I personally reviewed the heart care referral records from the PCPs office. EKGs performed at PCPs office showed normal sinus rhythm. Patient's Kardia mobile notified atrial fibrillation and also ZIO monitor revealed intermittent atrial fibrillation per daughter. I do not have Kardia mobile strips to review or ZIO monitor report/strips to review. Patient denied any palpitations but has SOB intermittently associated with bilateral lower extremity swelling ongoing for quite a while. She also has vertigo for which she takes meclizine. Intermittently she complained of feeling dizzy and extremely tired when she was washing dishes. She checks her blood pressures at home and has been a little elevated, 150-170 mmHg SBP but did not check her blood pressure when she was feeling dizzy. Metoprolol dose was decreased from 100 to 50 mg due to low heart rate 40 bpm in the past.   Past Medical History:  Diagnosis Date   Asthma    Diabetes mellitus without complication ([place])    Essential hypertension    Vertigo     Past Surgical History:  Procedure Laterality Date   CHOLECYSTECTOMY     HIP PINNING,CANNULATED Right 11/15/2017   Procedure: RIGHT HIP PINNING;  Surgeon: Renette Butters, MD;  Location: [facility];  Service: Orthopedics;  Laterality: Right;    Current Outpatient Medications  Medication Sig Dispense Refill   budesonide-formoterol (SYMBICORT) 160-4.5 MCG/ACT inhaler Inhale 2 puffs into the lungs 2 (two) times daily.     cetirizine (ZYRTEC) 10 MG tablet Take 10 mg by  mouth daily.     diazepam (VALIUM) 5 MG tablet Take 5 mg by mouth 3 (three) times daily as needed (for dizziness if meclizine does not provide relief).   0   enoxaparin (LOVENOX) 40 MG/0.4ML injection Inject 0.4 mLs (40 mg total) into the skin daily for 30 doses. For 30 days post op for DVT prophylaxis 30 Syringe 0   gabapentin (NEURONTIN) 100 MG capsule Take 100-200 mg by mouth See admin instructions. Take one capsule (100mg ) in the morning and at midday, then take two capsules (200mg ) at bedtime.  5   HYDROcodone-acetaminophen (NORCO/VICODIN) 5-325 MG tablet Take 1-2 tablets by mouth every 6 (six) hours as needed for pain.  0   meclizine (ANTIVERT) 25 MG tablet Take 25 mg by mouth 3 (three) times daily as needed for dizziness.  0   metFORMIN (GLUCOPHAGE) 500 MG tablet Take 500 mg by mouth daily with breakfast.     metoprolol succinate (TOPROL-XL) 100 MG 24 hr tablet Take 1 tablet (100 mg total) by mouth daily. Take with or immediately following a meal. 30 tablet 0   montelukast (SINGULAIR) 10 MG tablet Take 10 mg by mouth daily.  0   ondansetron (ZOFRAN) 4 MG tablet Take 1 tablet (4 mg total) by mouth every 8 (eight) hours as needed for nausea or vomiting. 40 tablet 0   potassium chloride SA (K-DUR,KLOR-CON) 20 MEQ tablet Take 20 mEq by mouth 2 (two) times daily.     predniSONE (DELTASONE) 5 MG tablet Take 5 mg by mouth daily.  0   tiotropium (SPIRIVA) 18  MCG inhalation capsule Place 18 mcg into inhaler and inhale daily as needed (wheezing or shortness of breath).     triazolam (HALCION) 0.25 MG tablet Take 0.25 mg by mouth at bedtime as needed for sleep.   0   No current facility-administered medications for this visit.   Allergies:  Asa [aspirin], Erythromycin, and Sulfa antibiotics   Social History: The patient  reports that she has never smoked. She has never used smokeless tobacco. She reports that she does not drink alcohol and does not use drugs.   Family History: The patient's family  history includes Hypertension in her brother.   ROS:  Please see the history of present illness. Otherwise, complete review of systems is positive for none.  All other systems are reviewed and negative.   Physical Exam: VS:  Ht 5\' 3"  (1.6 m)   Wt 156 lb 3.2 oz (70.9 kg)   BMI 27.67 kg/m , BMI Body mass index is 27.67 kg/m.  Wt Readings from Last 3 Encounters:  08/10/22 156 lb 3.2 oz (70.9 kg)  11/15/17 152 lb 1.9 oz (69 kg)  06/30/13 155 lb (70.3 kg)    General: Patient appears comfortable at rest. HEENT: Conjunctiva and lids normal, oropharynx clear with moist mucosa. Neck: Supple, no elevated JVP or carotid bruits, no thyromegaly. Lungs: Clear to auscultation, nonlabored breathing at rest. Cardiac: Regular rate and rhythm, no S3 or significant systolic murmur, no pericardial rub. Abdomen: Soft, nontender, no hepatomegaly, bowel sounds present, no guarding or rebound. Extremities: 2-3+ pitting edema in bilateral lower extremities, distal pulses 2+. Skin: Warm and dry. Musculoskeletal: No kyphosis. Neuropsychiatric: Alert and oriented x3, affect grossly appropriate.  ECG:  An ECG dated 08/10/2022 was personally reviewed today and demonstrated:  Normal sinus rhythm  Recent Labwork: No results found for requested labs within last 365 days.  No results found for: "CHOL", "TRIG", "HDL", "CHOLHDL", "VLDL", "LDLCALC", "LDLDIRECT"  Other Studies Reviewed Today: I personally reviewed the heart care records from the PCPs office  Assessment and Plan: Patient is 82 year old F known to have HTN, DM 2, asthma was referred to cardiology clinic for evaluation of paroxysmal atrial fibrillation.  # Atrial fibrillation on ZIO and Kardia mobile -Obtain Kardia mobile strips from the patient (patient forgot her mobile at home) and ZIO monitor strips from the PCP. No symptoms of palpitations.  # HTN, controlled # Dizziness -Discontinue HCTZ and start Lasix 20 mg once daily, irbesartan 300 mg  once daily and metoprolol succinate 50 mg once daily. HTN management per PCP. -Patient is instructed to check her blood pressures when she is feeling dizzy. If her blood pressure is noted to be low, she will need to space out her antihypertensive medications.  # Bilateral lower EXTR swelling, 2-3+ pitting edema -Discontinue HCTZ and start Lasix 20 mg once daily -Obtain 2D echocardiogram -Compression stockings  # Mild MR in 2019 -Obtain 2D echocardiogram  I have spent a total of 45 minutes with patient reviewing chart, EKGs, labs and examining patient as well as establishing an assessment and plan that was discussed with the patient.  > 50% of time was spent in direct patient care.      Medication Adjustments/Labs and Tests Ordered: Current medicines are reviewed at length with the patient today.  Concerns regarding medicines are outlined above.   Tests Ordered: No orders of the defined types were placed in this encounter.   Medication Changes: No orders of the defined types were placed in this encounter.   Disposition:  Follow up  6 months  Signed Sheron Robin Verne Spurr, MD, 08/10/2022 11:38 AM    [facility] at [facility] [address] [place], [place], [place] [address]

## 2022-08-10 NOTE — Patient Instructions (Addendum)
Medication Instructions:  Your physician has recommended you make the following change in your medication:  Stop hydrochlorothiazide Take furosemide 20 mg daily Continue other medications the same  Labwork: none  Testing/Procedures: Your physician has requested that you have an echocardiogram. Echocardiography is a painless test that uses sound waves to create images of your heart. It provides your doctor with information about the size and shape of your heart and how well your heart's chambers and valves are working. This procedure takes approximately one hour. There are no restrictions for this procedure. Please do NOT wear cologne, perfume, aftershave, or lotions (deodorant is allowed). Please arrive 15 minutes prior to your appointment time.  Follow-Up: Your physician recommends that you schedule a follow-up appointment in: 6 months  Any Other Special Instructions Will Be Listed Below (If Applicable). Please send kardia mobile strips  If you need a refill on your cardiac medications before your next appointment, please call your pharmacy.

## 2022-08-28 ENCOUNTER — Other Ambulatory Visit: Payer: Self-pay

## 2022-08-28 MED ORDER — HYDROCHLOROTHIAZIDE 25 MG PO TABS: 25.0000 mg | ORAL_TABLET | Freq: Every day | ORAL | 3 refills | Status: DC

## 2022-09-04 ENCOUNTER — Ambulatory Visit: Payer: Medicare Other | Attending: Internal Medicine

## 2022-09-04 DIAGNOSIS — I340 Nonrheumatic mitral (valve) insufficiency: Secondary | ICD-10-CM | POA: Diagnosis not present

## 2022-09-05 LAB — ECHOCARDIOGRAM COMPLETE
AR max vel: 2.19 cm2
AV Area VTI: 2.18 cm2
AV Area mean vel: 2.01 cm2
AV Mean grad: 7.8 mmHg
AV Peak grad: 11.2 mmHg
Ao pk vel: 1.68 m/s
Area-P 1/2: 3.16 cm2
Calc EF: 65 %
Est EF: >75
MV M vel: 4.97 m/s
MV Peak grad: 98.9 mmHg
S' Lateral: 1.5 cm
Single Plane A2C EF: 65.3 %
Single Plane A4C EF: 65 %

## 2022-09-11 DIAGNOSIS — I480 Paroxysmal atrial fibrillation: Secondary | ICD-10-CM | POA: Diagnosis not present

## 2022-09-11 DIAGNOSIS — E1129 Type 2 diabetes mellitus with other diabetic kidney complication: Secondary | ICD-10-CM | POA: Diagnosis not present

## 2022-09-11 DIAGNOSIS — Z299 Encounter for prophylactic measures, unspecified: Secondary | ICD-10-CM | POA: Diagnosis not present

## 2022-09-11 DIAGNOSIS — E1165 Type 2 diabetes mellitus with hyperglycemia: Secondary | ICD-10-CM | POA: Diagnosis not present

## 2022-09-11 DIAGNOSIS — I10 Essential (primary) hypertension: Secondary | ICD-10-CM | POA: Diagnosis not present

## 2022-09-11 DIAGNOSIS — N1831 Chronic kidney disease, stage 3a: Secondary | ICD-10-CM | POA: Diagnosis not present

## 2022-09-26 DIAGNOSIS — N1831 Chronic kidney disease, stage 3a: Secondary | ICD-10-CM | POA: Diagnosis not present

## 2022-09-26 DIAGNOSIS — I129 Hypertensive chronic kidney disease with stage 1 through stage 4 chronic kidney disease, or unspecified chronic kidney disease: Secondary | ICD-10-CM | POA: Diagnosis not present

## 2022-09-26 DIAGNOSIS — E559 Vitamin D deficiency, unspecified: Secondary | ICD-10-CM | POA: Diagnosis not present

## 2022-09-26 DIAGNOSIS — E1142 Type 2 diabetes mellitus with diabetic polyneuropathy: Secondary | ICD-10-CM | POA: Diagnosis not present

## 2022-09-26 DIAGNOSIS — M810 Age-related osteoporosis without current pathological fracture: Secondary | ICD-10-CM | POA: Diagnosis not present

## 2022-09-26 DIAGNOSIS — J45909 Unspecified asthma, uncomplicated: Secondary | ICD-10-CM | POA: Diagnosis not present

## 2022-09-26 DIAGNOSIS — Z7984 Long term (current) use of oral hypoglycemic drugs: Secondary | ICD-10-CM | POA: Diagnosis not present

## 2022-09-26 DIAGNOSIS — R2689 Other abnormalities of gait and mobility: Secondary | ICD-10-CM | POA: Diagnosis not present

## 2022-09-26 DIAGNOSIS — E1165 Type 2 diabetes mellitus with hyperglycemia: Secondary | ICD-10-CM | POA: Diagnosis not present

## 2022-09-26 DIAGNOSIS — I480 Paroxysmal atrial fibrillation: Secondary | ICD-10-CM | POA: Diagnosis not present

## 2022-10-02 DIAGNOSIS — R2689 Other abnormalities of gait and mobility: Secondary | ICD-10-CM | POA: Diagnosis not present

## 2022-10-02 DIAGNOSIS — Z7984 Long term (current) use of oral hypoglycemic drugs: Secondary | ICD-10-CM | POA: Diagnosis not present

## 2022-10-02 DIAGNOSIS — J45909 Unspecified asthma, uncomplicated: Secondary | ICD-10-CM | POA: Diagnosis not present

## 2022-10-02 DIAGNOSIS — E1142 Type 2 diabetes mellitus with diabetic polyneuropathy: Secondary | ICD-10-CM | POA: Diagnosis not present

## 2022-10-02 DIAGNOSIS — I480 Paroxysmal atrial fibrillation: Secondary | ICD-10-CM | POA: Diagnosis not present

## 2022-10-02 DIAGNOSIS — I129 Hypertensive chronic kidney disease with stage 1 through stage 4 chronic kidney disease, or unspecified chronic kidney disease: Secondary | ICD-10-CM | POA: Diagnosis not present

## 2022-10-02 DIAGNOSIS — M810 Age-related osteoporosis without current pathological fracture: Secondary | ICD-10-CM | POA: Diagnosis not present

## 2022-10-02 DIAGNOSIS — E1165 Type 2 diabetes mellitus with hyperglycemia: Secondary | ICD-10-CM | POA: Diagnosis not present

## 2022-10-02 DIAGNOSIS — N1831 Chronic kidney disease, stage 3a: Secondary | ICD-10-CM | POA: Diagnosis not present

## 2022-10-02 DIAGNOSIS — E559 Vitamin D deficiency, unspecified: Secondary | ICD-10-CM | POA: Diagnosis not present

## 2022-10-04 DIAGNOSIS — I129 Hypertensive chronic kidney disease with stage 1 through stage 4 chronic kidney disease, or unspecified chronic kidney disease: Secondary | ICD-10-CM | POA: Diagnosis not present

## 2022-10-04 DIAGNOSIS — J45909 Unspecified asthma, uncomplicated: Secondary | ICD-10-CM | POA: Diagnosis not present

## 2022-10-04 DIAGNOSIS — E1165 Type 2 diabetes mellitus with hyperglycemia: Secondary | ICD-10-CM | POA: Diagnosis not present

## 2022-10-04 DIAGNOSIS — N1831 Chronic kidney disease, stage 3a: Secondary | ICD-10-CM | POA: Diagnosis not present

## 2022-10-04 DIAGNOSIS — Z7984 Long term (current) use of oral hypoglycemic drugs: Secondary | ICD-10-CM | POA: Diagnosis not present

## 2022-10-04 DIAGNOSIS — R2689 Other abnormalities of gait and mobility: Secondary | ICD-10-CM | POA: Diagnosis not present

## 2022-10-04 DIAGNOSIS — M810 Age-related osteoporosis without current pathological fracture: Secondary | ICD-10-CM | POA: Diagnosis not present

## 2022-10-04 DIAGNOSIS — I480 Paroxysmal atrial fibrillation: Secondary | ICD-10-CM | POA: Diagnosis not present

## 2022-10-04 DIAGNOSIS — E559 Vitamin D deficiency, unspecified: Secondary | ICD-10-CM | POA: Diagnosis not present

## 2022-10-04 DIAGNOSIS — E1142 Type 2 diabetes mellitus with diabetic polyneuropathy: Secondary | ICD-10-CM | POA: Diagnosis not present

## 2022-10-09 DIAGNOSIS — I129 Hypertensive chronic kidney disease with stage 1 through stage 4 chronic kidney disease, or unspecified chronic kidney disease: Secondary | ICD-10-CM | POA: Diagnosis not present

## 2022-10-09 DIAGNOSIS — E1165 Type 2 diabetes mellitus with hyperglycemia: Secondary | ICD-10-CM | POA: Diagnosis not present

## 2022-10-09 DIAGNOSIS — M810 Age-related osteoporosis without current pathological fracture: Secondary | ICD-10-CM | POA: Diagnosis not present

## 2022-10-09 DIAGNOSIS — E1142 Type 2 diabetes mellitus with diabetic polyneuropathy: Secondary | ICD-10-CM | POA: Diagnosis not present

## 2022-10-09 DIAGNOSIS — R2689 Other abnormalities of gait and mobility: Secondary | ICD-10-CM | POA: Diagnosis not present

## 2022-10-09 DIAGNOSIS — E559 Vitamin D deficiency, unspecified: Secondary | ICD-10-CM | POA: Diagnosis not present

## 2022-10-09 DIAGNOSIS — N1831 Chronic kidney disease, stage 3a: Secondary | ICD-10-CM | POA: Diagnosis not present

## 2022-10-09 DIAGNOSIS — I480 Paroxysmal atrial fibrillation: Secondary | ICD-10-CM | POA: Diagnosis not present

## 2022-10-09 DIAGNOSIS — J45909 Unspecified asthma, uncomplicated: Secondary | ICD-10-CM | POA: Diagnosis not present

## 2022-10-09 DIAGNOSIS — Z7984 Long term (current) use of oral hypoglycemic drugs: Secondary | ICD-10-CM | POA: Diagnosis not present

## 2022-10-11 DIAGNOSIS — E1165 Type 2 diabetes mellitus with hyperglycemia: Secondary | ICD-10-CM | POA: Diagnosis not present

## 2022-10-11 DIAGNOSIS — E1142 Type 2 diabetes mellitus with diabetic polyneuropathy: Secondary | ICD-10-CM | POA: Diagnosis not present

## 2022-10-11 DIAGNOSIS — N1831 Chronic kidney disease, stage 3a: Secondary | ICD-10-CM | POA: Diagnosis not present

## 2022-10-11 DIAGNOSIS — I480 Paroxysmal atrial fibrillation: Secondary | ICD-10-CM | POA: Diagnosis not present

## 2022-10-11 DIAGNOSIS — E559 Vitamin D deficiency, unspecified: Secondary | ICD-10-CM | POA: Diagnosis not present

## 2022-10-11 DIAGNOSIS — M810 Age-related osteoporosis without current pathological fracture: Secondary | ICD-10-CM | POA: Diagnosis not present

## 2022-10-11 DIAGNOSIS — J45909 Unspecified asthma, uncomplicated: Secondary | ICD-10-CM | POA: Diagnosis not present

## 2022-10-11 DIAGNOSIS — I129 Hypertensive chronic kidney disease with stage 1 through stage 4 chronic kidney disease, or unspecified chronic kidney disease: Secondary | ICD-10-CM | POA: Diagnosis not present

## 2022-10-11 DIAGNOSIS — Z7984 Long term (current) use of oral hypoglycemic drugs: Secondary | ICD-10-CM | POA: Diagnosis not present

## 2022-10-11 DIAGNOSIS — R2689 Other abnormalities of gait and mobility: Secondary | ICD-10-CM | POA: Diagnosis not present

## 2022-10-25 DIAGNOSIS — I1310 Hypertensive heart and chronic kidney disease without heart failure, with stage 1 through stage 4 chronic kidney disease, or unspecified chronic kidney disease: Secondary | ICD-10-CM | POA: Diagnosis not present

## 2022-10-25 DIAGNOSIS — E7800 Pure hypercholesterolemia, unspecified: Secondary | ICD-10-CM | POA: Diagnosis not present

## 2022-10-25 DIAGNOSIS — E1122 Type 2 diabetes mellitus with diabetic chronic kidney disease: Secondary | ICD-10-CM | POA: Diagnosis not present

## 2022-10-25 DIAGNOSIS — J45909 Unspecified asthma, uncomplicated: Secondary | ICD-10-CM | POA: Diagnosis not present

## 2022-10-25 DIAGNOSIS — N1831 Chronic kidney disease, stage 3a: Secondary | ICD-10-CM | POA: Diagnosis not present

## 2022-10-25 DIAGNOSIS — I480 Paroxysmal atrial fibrillation: Secondary | ICD-10-CM | POA: Diagnosis not present

## 2022-10-25 DIAGNOSIS — Z7984 Long term (current) use of oral hypoglycemic drugs: Secondary | ICD-10-CM | POA: Diagnosis not present

## 2022-10-25 DIAGNOSIS — E1142 Type 2 diabetes mellitus with diabetic polyneuropathy: Secondary | ICD-10-CM | POA: Diagnosis not present

## 2022-10-25 DIAGNOSIS — E1165 Type 2 diabetes mellitus with hyperglycemia: Secondary | ICD-10-CM | POA: Diagnosis not present

## 2022-10-31 DIAGNOSIS — E1122 Type 2 diabetes mellitus with diabetic chronic kidney disease: Secondary | ICD-10-CM | POA: Diagnosis not present

## 2022-10-31 DIAGNOSIS — I480 Paroxysmal atrial fibrillation: Secondary | ICD-10-CM | POA: Diagnosis not present

## 2022-10-31 DIAGNOSIS — E1165 Type 2 diabetes mellitus with hyperglycemia: Secondary | ICD-10-CM | POA: Diagnosis not present

## 2022-10-31 DIAGNOSIS — E1142 Type 2 diabetes mellitus with diabetic polyneuropathy: Secondary | ICD-10-CM | POA: Diagnosis not present

## 2022-11-01 DIAGNOSIS — I1310 Hypertensive heart and chronic kidney disease without heart failure, with stage 1 through stage 4 chronic kidney disease, or unspecified chronic kidney disease: Secondary | ICD-10-CM | POA: Diagnosis not present

## 2022-11-01 DIAGNOSIS — N1831 Chronic kidney disease, stage 3a: Secondary | ICD-10-CM | POA: Diagnosis not present

## 2022-11-01 DIAGNOSIS — I480 Paroxysmal atrial fibrillation: Secondary | ICD-10-CM | POA: Diagnosis not present

## 2022-11-01 DIAGNOSIS — Z7984 Long term (current) use of oral hypoglycemic drugs: Secondary | ICD-10-CM | POA: Diagnosis not present

## 2022-11-01 DIAGNOSIS — E1142 Type 2 diabetes mellitus with diabetic polyneuropathy: Secondary | ICD-10-CM | POA: Diagnosis not present

## 2022-11-01 DIAGNOSIS — E7800 Pure hypercholesterolemia, unspecified: Secondary | ICD-10-CM | POA: Diagnosis not present

## 2022-11-01 DIAGNOSIS — J45909 Unspecified asthma, uncomplicated: Secondary | ICD-10-CM | POA: Diagnosis not present

## 2022-11-01 DIAGNOSIS — E1165 Type 2 diabetes mellitus with hyperglycemia: Secondary | ICD-10-CM | POA: Diagnosis not present

## 2022-11-01 DIAGNOSIS — E1122 Type 2 diabetes mellitus with diabetic chronic kidney disease: Secondary | ICD-10-CM | POA: Diagnosis not present

## 2022-11-07 DIAGNOSIS — E1165 Type 2 diabetes mellitus with hyperglycemia: Secondary | ICD-10-CM | POA: Diagnosis not present

## 2022-11-07 DIAGNOSIS — E7800 Pure hypercholesterolemia, unspecified: Secondary | ICD-10-CM | POA: Diagnosis not present

## 2022-11-07 DIAGNOSIS — Z7984 Long term (current) use of oral hypoglycemic drugs: Secondary | ICD-10-CM | POA: Diagnosis not present

## 2022-11-07 DIAGNOSIS — N1831 Chronic kidney disease, stage 3a: Secondary | ICD-10-CM | POA: Diagnosis not present

## 2022-11-07 DIAGNOSIS — E1122 Type 2 diabetes mellitus with diabetic chronic kidney disease: Secondary | ICD-10-CM | POA: Diagnosis not present

## 2022-11-07 DIAGNOSIS — E1142 Type 2 diabetes mellitus with diabetic polyneuropathy: Secondary | ICD-10-CM | POA: Diagnosis not present

## 2022-11-07 DIAGNOSIS — I1310 Hypertensive heart and chronic kidney disease without heart failure, with stage 1 through stage 4 chronic kidney disease, or unspecified chronic kidney disease: Secondary | ICD-10-CM | POA: Diagnosis not present

## 2022-11-07 DIAGNOSIS — I480 Paroxysmal atrial fibrillation: Secondary | ICD-10-CM | POA: Diagnosis not present

## 2022-11-07 DIAGNOSIS — J45909 Unspecified asthma, uncomplicated: Secondary | ICD-10-CM | POA: Diagnosis not present

## 2022-11-10 DIAGNOSIS — E7800 Pure hypercholesterolemia, unspecified: Secondary | ICD-10-CM | POA: Diagnosis not present

## 2022-11-10 DIAGNOSIS — I1310 Hypertensive heart and chronic kidney disease without heart failure, with stage 1 through stage 4 chronic kidney disease, or unspecified chronic kidney disease: Secondary | ICD-10-CM | POA: Diagnosis not present

## 2022-11-10 DIAGNOSIS — N1831 Chronic kidney disease, stage 3a: Secondary | ICD-10-CM | POA: Diagnosis not present

## 2022-11-10 DIAGNOSIS — E1122 Type 2 diabetes mellitus with diabetic chronic kidney disease: Secondary | ICD-10-CM | POA: Diagnosis not present

## 2022-11-10 DIAGNOSIS — E1165 Type 2 diabetes mellitus with hyperglycemia: Secondary | ICD-10-CM | POA: Diagnosis not present

## 2022-11-10 DIAGNOSIS — I480 Paroxysmal atrial fibrillation: Secondary | ICD-10-CM | POA: Diagnosis not present

## 2022-11-10 DIAGNOSIS — Z7984 Long term (current) use of oral hypoglycemic drugs: Secondary | ICD-10-CM | POA: Diagnosis not present

## 2022-11-10 DIAGNOSIS — E1142 Type 2 diabetes mellitus with diabetic polyneuropathy: Secondary | ICD-10-CM | POA: Diagnosis not present

## 2022-11-10 DIAGNOSIS — J45909 Unspecified asthma, uncomplicated: Secondary | ICD-10-CM | POA: Diagnosis not present

## 2022-11-14 DIAGNOSIS — I480 Paroxysmal atrial fibrillation: Secondary | ICD-10-CM | POA: Diagnosis not present

## 2022-11-14 DIAGNOSIS — J45909 Unspecified asthma, uncomplicated: Secondary | ICD-10-CM | POA: Diagnosis not present

## 2022-11-14 DIAGNOSIS — E7800 Pure hypercholesterolemia, unspecified: Secondary | ICD-10-CM | POA: Diagnosis not present

## 2022-11-14 DIAGNOSIS — I1310 Hypertensive heart and chronic kidney disease without heart failure, with stage 1 through stage 4 chronic kidney disease, or unspecified chronic kidney disease: Secondary | ICD-10-CM | POA: Diagnosis not present

## 2022-11-14 DIAGNOSIS — E1142 Type 2 diabetes mellitus with diabetic polyneuropathy: Secondary | ICD-10-CM | POA: Diagnosis not present

## 2022-11-14 DIAGNOSIS — N1831 Chronic kidney disease, stage 3a: Secondary | ICD-10-CM | POA: Diagnosis not present

## 2022-11-14 DIAGNOSIS — Z7984 Long term (current) use of oral hypoglycemic drugs: Secondary | ICD-10-CM | POA: Diagnosis not present

## 2022-11-14 DIAGNOSIS — E1165 Type 2 diabetes mellitus with hyperglycemia: Secondary | ICD-10-CM | POA: Diagnosis not present

## 2022-11-14 DIAGNOSIS — E1122 Type 2 diabetes mellitus with diabetic chronic kidney disease: Secondary | ICD-10-CM | POA: Diagnosis not present

## 2022-11-17 DIAGNOSIS — E1165 Type 2 diabetes mellitus with hyperglycemia: Secondary | ICD-10-CM | POA: Diagnosis not present

## 2022-11-17 DIAGNOSIS — J45909 Unspecified asthma, uncomplicated: Secondary | ICD-10-CM | POA: Diagnosis not present

## 2022-11-17 DIAGNOSIS — E1142 Type 2 diabetes mellitus with diabetic polyneuropathy: Secondary | ICD-10-CM | POA: Diagnosis not present

## 2022-11-17 DIAGNOSIS — E1122 Type 2 diabetes mellitus with diabetic chronic kidney disease: Secondary | ICD-10-CM | POA: Diagnosis not present

## 2022-11-17 DIAGNOSIS — Z7984 Long term (current) use of oral hypoglycemic drugs: Secondary | ICD-10-CM | POA: Diagnosis not present

## 2022-11-17 DIAGNOSIS — E7800 Pure hypercholesterolemia, unspecified: Secondary | ICD-10-CM | POA: Diagnosis not present

## 2022-11-17 DIAGNOSIS — I480 Paroxysmal atrial fibrillation: Secondary | ICD-10-CM | POA: Diagnosis not present

## 2022-11-17 DIAGNOSIS — I1310 Hypertensive heart and chronic kidney disease without heart failure, with stage 1 through stage 4 chronic kidney disease, or unspecified chronic kidney disease: Secondary | ICD-10-CM | POA: Diagnosis not present

## 2022-11-17 DIAGNOSIS — N1831 Chronic kidney disease, stage 3a: Secondary | ICD-10-CM | POA: Diagnosis not present

## 2022-11-22 DIAGNOSIS — Z961 Presence of intraocular lens: Secondary | ICD-10-CM | POA: Diagnosis not present

## 2022-11-22 DIAGNOSIS — H04123 Dry eye syndrome of bilateral lacrimal glands: Secondary | ICD-10-CM | POA: Diagnosis not present

## 2022-11-22 DIAGNOSIS — E119 Type 2 diabetes mellitus without complications: Secondary | ICD-10-CM | POA: Diagnosis not present

## 2022-11-22 DIAGNOSIS — H40013 Open angle with borderline findings, low risk, bilateral: Secondary | ICD-10-CM | POA: Diagnosis not present

## 2022-11-23 DIAGNOSIS — E1165 Type 2 diabetes mellitus with hyperglycemia: Secondary | ICD-10-CM | POA: Diagnosis not present

## 2022-11-23 DIAGNOSIS — Z7984 Long term (current) use of oral hypoglycemic drugs: Secondary | ICD-10-CM | POA: Diagnosis not present

## 2022-11-23 DIAGNOSIS — I1310 Hypertensive heart and chronic kidney disease without heart failure, with stage 1 through stage 4 chronic kidney disease, or unspecified chronic kidney disease: Secondary | ICD-10-CM | POA: Diagnosis not present

## 2022-11-23 DIAGNOSIS — E1142 Type 2 diabetes mellitus with diabetic polyneuropathy: Secondary | ICD-10-CM | POA: Diagnosis not present

## 2022-11-23 DIAGNOSIS — J45909 Unspecified asthma, uncomplicated: Secondary | ICD-10-CM | POA: Diagnosis not present

## 2022-11-23 DIAGNOSIS — N1831 Chronic kidney disease, stage 3a: Secondary | ICD-10-CM | POA: Diagnosis not present

## 2022-11-23 DIAGNOSIS — I480 Paroxysmal atrial fibrillation: Secondary | ICD-10-CM | POA: Diagnosis not present

## 2022-11-23 DIAGNOSIS — E7800 Pure hypercholesterolemia, unspecified: Secondary | ICD-10-CM | POA: Diagnosis not present

## 2022-11-23 DIAGNOSIS — E1122 Type 2 diabetes mellitus with diabetic chronic kidney disease: Secondary | ICD-10-CM | POA: Diagnosis not present

## 2022-11-24 DIAGNOSIS — N1831 Chronic kidney disease, stage 3a: Secondary | ICD-10-CM | POA: Diagnosis not present

## 2022-11-24 DIAGNOSIS — E7800 Pure hypercholesterolemia, unspecified: Secondary | ICD-10-CM | POA: Diagnosis not present

## 2022-11-24 DIAGNOSIS — E1122 Type 2 diabetes mellitus with diabetic chronic kidney disease: Secondary | ICD-10-CM | POA: Diagnosis not present

## 2022-11-24 DIAGNOSIS — I480 Paroxysmal atrial fibrillation: Secondary | ICD-10-CM | POA: Diagnosis not present

## 2022-11-24 DIAGNOSIS — E1165 Type 2 diabetes mellitus with hyperglycemia: Secondary | ICD-10-CM | POA: Diagnosis not present

## 2022-11-24 DIAGNOSIS — Z7984 Long term (current) use of oral hypoglycemic drugs: Secondary | ICD-10-CM | POA: Diagnosis not present

## 2022-11-24 DIAGNOSIS — E1142 Type 2 diabetes mellitus with diabetic polyneuropathy: Secondary | ICD-10-CM | POA: Diagnosis not present

## 2022-11-24 DIAGNOSIS — I1310 Hypertensive heart and chronic kidney disease without heart failure, with stage 1 through stage 4 chronic kidney disease, or unspecified chronic kidney disease: Secondary | ICD-10-CM | POA: Diagnosis not present

## 2022-11-24 DIAGNOSIS — J45909 Unspecified asthma, uncomplicated: Secondary | ICD-10-CM | POA: Diagnosis not present

## 2022-11-28 DIAGNOSIS — E1142 Type 2 diabetes mellitus with diabetic polyneuropathy: Secondary | ICD-10-CM | POA: Diagnosis not present

## 2022-11-28 DIAGNOSIS — I1310 Hypertensive heart and chronic kidney disease without heart failure, with stage 1 through stage 4 chronic kidney disease, or unspecified chronic kidney disease: Secondary | ICD-10-CM | POA: Diagnosis not present

## 2022-11-28 DIAGNOSIS — I480 Paroxysmal atrial fibrillation: Secondary | ICD-10-CM | POA: Diagnosis not present

## 2022-11-28 DIAGNOSIS — E1165 Type 2 diabetes mellitus with hyperglycemia: Secondary | ICD-10-CM | POA: Diagnosis not present

## 2022-11-28 DIAGNOSIS — E7800 Pure hypercholesterolemia, unspecified: Secondary | ICD-10-CM | POA: Diagnosis not present

## 2022-11-28 DIAGNOSIS — E1122 Type 2 diabetes mellitus with diabetic chronic kidney disease: Secondary | ICD-10-CM | POA: Diagnosis not present

## 2022-11-28 DIAGNOSIS — N1831 Chronic kidney disease, stage 3a: Secondary | ICD-10-CM | POA: Diagnosis not present

## 2022-11-28 DIAGNOSIS — J45909 Unspecified asthma, uncomplicated: Secondary | ICD-10-CM | POA: Diagnosis not present

## 2022-11-28 DIAGNOSIS — Z7984 Long term (current) use of oral hypoglycemic drugs: Secondary | ICD-10-CM | POA: Diagnosis not present

## 2022-12-04 DIAGNOSIS — I480 Paroxysmal atrial fibrillation: Secondary | ICD-10-CM | POA: Diagnosis not present

## 2022-12-04 DIAGNOSIS — J45909 Unspecified asthma, uncomplicated: Secondary | ICD-10-CM | POA: Diagnosis not present

## 2022-12-04 DIAGNOSIS — I1310 Hypertensive heart and chronic kidney disease without heart failure, with stage 1 through stage 4 chronic kidney disease, or unspecified chronic kidney disease: Secondary | ICD-10-CM | POA: Diagnosis not present

## 2022-12-04 DIAGNOSIS — E1122 Type 2 diabetes mellitus with diabetic chronic kidney disease: Secondary | ICD-10-CM | POA: Diagnosis not present

## 2022-12-04 DIAGNOSIS — E7800 Pure hypercholesterolemia, unspecified: Secondary | ICD-10-CM | POA: Diagnosis not present

## 2022-12-04 DIAGNOSIS — Z7984 Long term (current) use of oral hypoglycemic drugs: Secondary | ICD-10-CM | POA: Diagnosis not present

## 2022-12-04 DIAGNOSIS — N1831 Chronic kidney disease, stage 3a: Secondary | ICD-10-CM | POA: Diagnosis not present

## 2022-12-04 DIAGNOSIS — E1165 Type 2 diabetes mellitus with hyperglycemia: Secondary | ICD-10-CM | POA: Diagnosis not present

## 2022-12-04 DIAGNOSIS — E1142 Type 2 diabetes mellitus with diabetic polyneuropathy: Secondary | ICD-10-CM | POA: Diagnosis not present

## 2022-12-14 DIAGNOSIS — E1122 Type 2 diabetes mellitus with diabetic chronic kidney disease: Secondary | ICD-10-CM | POA: Diagnosis not present

## 2022-12-14 DIAGNOSIS — I480 Paroxysmal atrial fibrillation: Secondary | ICD-10-CM | POA: Diagnosis not present

## 2022-12-14 DIAGNOSIS — J45909 Unspecified asthma, uncomplicated: Secondary | ICD-10-CM | POA: Diagnosis not present

## 2022-12-14 DIAGNOSIS — E1165 Type 2 diabetes mellitus with hyperglycemia: Secondary | ICD-10-CM | POA: Diagnosis not present

## 2022-12-14 DIAGNOSIS — Z7984 Long term (current) use of oral hypoglycemic drugs: Secondary | ICD-10-CM | POA: Diagnosis not present

## 2022-12-14 DIAGNOSIS — E7800 Pure hypercholesterolemia, unspecified: Secondary | ICD-10-CM | POA: Diagnosis not present

## 2022-12-14 DIAGNOSIS — I1310 Hypertensive heart and chronic kidney disease without heart failure, with stage 1 through stage 4 chronic kidney disease, or unspecified chronic kidney disease: Secondary | ICD-10-CM | POA: Diagnosis not present

## 2022-12-14 DIAGNOSIS — E1142 Type 2 diabetes mellitus with diabetic polyneuropathy: Secondary | ICD-10-CM | POA: Diagnosis not present

## 2022-12-14 DIAGNOSIS — N1831 Chronic kidney disease, stage 3a: Secondary | ICD-10-CM | POA: Diagnosis not present

## 2022-12-21 DIAGNOSIS — J45909 Unspecified asthma, uncomplicated: Secondary | ICD-10-CM | POA: Diagnosis not present

## 2022-12-21 DIAGNOSIS — I480 Paroxysmal atrial fibrillation: Secondary | ICD-10-CM | POA: Diagnosis not present

## 2022-12-21 DIAGNOSIS — E1122 Type 2 diabetes mellitus with diabetic chronic kidney disease: Secondary | ICD-10-CM | POA: Diagnosis not present

## 2022-12-21 DIAGNOSIS — E1165 Type 2 diabetes mellitus with hyperglycemia: Secondary | ICD-10-CM | POA: Diagnosis not present

## 2022-12-21 DIAGNOSIS — I1310 Hypertensive heart and chronic kidney disease without heart failure, with stage 1 through stage 4 chronic kidney disease, or unspecified chronic kidney disease: Secondary | ICD-10-CM | POA: Diagnosis not present

## 2022-12-21 DIAGNOSIS — Z7984 Long term (current) use of oral hypoglycemic drugs: Secondary | ICD-10-CM | POA: Diagnosis not present

## 2022-12-21 DIAGNOSIS — E1142 Type 2 diabetes mellitus with diabetic polyneuropathy: Secondary | ICD-10-CM | POA: Diagnosis not present

## 2022-12-21 DIAGNOSIS — N1831 Chronic kidney disease, stage 3a: Secondary | ICD-10-CM | POA: Diagnosis not present

## 2022-12-21 DIAGNOSIS — E7800 Pure hypercholesterolemia, unspecified: Secondary | ICD-10-CM | POA: Diagnosis not present

## 2022-12-22 DIAGNOSIS — R5383 Other fatigue: Secondary | ICD-10-CM | POA: Diagnosis not present

## 2022-12-22 DIAGNOSIS — I10 Essential (primary) hypertension: Secondary | ICD-10-CM | POA: Diagnosis not present

## 2022-12-22 DIAGNOSIS — Z0000 Encounter for general adult medical examination without abnormal findings: Secondary | ICD-10-CM | POA: Diagnosis not present

## 2022-12-22 DIAGNOSIS — Z79899 Other long term (current) drug therapy: Secondary | ICD-10-CM | POA: Diagnosis not present

## 2022-12-22 DIAGNOSIS — Z299 Encounter for prophylactic measures, unspecified: Secondary | ICD-10-CM | POA: Diagnosis not present

## 2022-12-22 DIAGNOSIS — E7800 Pure hypercholesterolemia, unspecified: Secondary | ICD-10-CM | POA: Diagnosis not present

## 2022-12-22 DIAGNOSIS — E1142 Type 2 diabetes mellitus with diabetic polyneuropathy: Secondary | ICD-10-CM | POA: Diagnosis not present

## 2022-12-22 DIAGNOSIS — E1165 Type 2 diabetes mellitus with hyperglycemia: Secondary | ICD-10-CM | POA: Diagnosis not present

## 2023-01-31 ENCOUNTER — Telehealth: Payer: Self-pay | Admitting: Internal Medicine

## 2023-01-31 NOTE — Telephone Encounter (Signed)
Called back spoke with Asher Muir (daughter) states that she wanted to see if we have received the monitor readings from [facility]. Advised her that we have not. States patient has an appointment with her next week, will obtain a copy and either bring to next appointment or drop them off.

## 2023-01-31 NOTE — Telephone Encounter (Signed)
Daughter wants call back to confirm provider sent heart readings.

## 2023-02-06 DIAGNOSIS — N1831 Chronic kidney disease, stage 3a: Secondary | ICD-10-CM | POA: Diagnosis not present

## 2023-02-06 DIAGNOSIS — Z299 Encounter for prophylactic measures, unspecified: Secondary | ICD-10-CM | POA: Diagnosis not present

## 2023-02-06 DIAGNOSIS — R52 Pain, unspecified: Secondary | ICD-10-CM | POA: Diagnosis not present

## 2023-02-06 DIAGNOSIS — I480 Paroxysmal atrial fibrillation: Secondary | ICD-10-CM | POA: Diagnosis not present

## 2023-02-06 DIAGNOSIS — I10 Essential (primary) hypertension: Secondary | ICD-10-CM | POA: Diagnosis not present

## 2023-02-12 ENCOUNTER — Ambulatory Visit: Payer: Medicare Other | Admitting: Internal Medicine

## 2023-05-31 DIAGNOSIS — I10 Essential (primary) hypertension: Secondary | ICD-10-CM | POA: Diagnosis not present

## 2023-05-31 DIAGNOSIS — Z2821 Immunization not carried out because of patient refusal: Secondary | ICD-10-CM | POA: Diagnosis not present

## 2023-05-31 DIAGNOSIS — Z0000 Encounter for general adult medical examination without abnormal findings: Secondary | ICD-10-CM | POA: Diagnosis not present

## 2023-05-31 DIAGNOSIS — R52 Pain, unspecified: Secondary | ICD-10-CM | POA: Diagnosis not present

## 2023-05-31 DIAGNOSIS — Z1339 Encounter for screening examination for other mental health and behavioral disorders: Secondary | ICD-10-CM | POA: Diagnosis not present

## 2023-05-31 DIAGNOSIS — N1831 Chronic kidney disease, stage 3a: Secondary | ICD-10-CM | POA: Diagnosis not present

## 2023-05-31 DIAGNOSIS — I480 Paroxysmal atrial fibrillation: Secondary | ICD-10-CM | POA: Diagnosis not present

## 2023-05-31 DIAGNOSIS — Z1331 Encounter for screening for depression: Secondary | ICD-10-CM | POA: Diagnosis not present

## 2023-05-31 DIAGNOSIS — Z299 Encounter for prophylactic measures, unspecified: Secondary | ICD-10-CM | POA: Diagnosis not present

## 2023-06-25 DIAGNOSIS — E1169 Type 2 diabetes mellitus with other specified complication: Secondary | ICD-10-CM | POA: Diagnosis not present

## 2023-06-25 DIAGNOSIS — Z299 Encounter for prophylactic measures, unspecified: Secondary | ICD-10-CM | POA: Diagnosis not present

## 2023-06-25 DIAGNOSIS — I10 Essential (primary) hypertension: Secondary | ICD-10-CM | POA: Diagnosis not present

## 2023-06-25 DIAGNOSIS — R52 Pain, unspecified: Secondary | ICD-10-CM | POA: Diagnosis not present

## 2023-09-28 DIAGNOSIS — E1169 Type 2 diabetes mellitus with other specified complication: Secondary | ICD-10-CM | POA: Diagnosis not present

## 2023-09-28 DIAGNOSIS — R52 Pain, unspecified: Secondary | ICD-10-CM | POA: Diagnosis not present

## 2023-09-28 DIAGNOSIS — Z299 Encounter for prophylactic measures, unspecified: Secondary | ICD-10-CM | POA: Diagnosis not present

## 2023-09-28 DIAGNOSIS — I10 Essential (primary) hypertension: Secondary | ICD-10-CM | POA: Diagnosis not present

## 2023-12-11 DIAGNOSIS — I10 Essential (primary) hypertension: Secondary | ICD-10-CM | POA: Diagnosis not present

## 2023-12-11 DIAGNOSIS — Z1331 Encounter for screening for depression: Secondary | ICD-10-CM | POA: Diagnosis not present

## 2023-12-11 DIAGNOSIS — Z299 Encounter for prophylactic measures, unspecified: Secondary | ICD-10-CM | POA: Diagnosis not present

## 2023-12-11 DIAGNOSIS — R52 Pain, unspecified: Secondary | ICD-10-CM | POA: Diagnosis not present

## 2023-12-11 DIAGNOSIS — Z1339 Encounter for screening examination for other mental health and behavioral disorders: Secondary | ICD-10-CM | POA: Diagnosis not present

## 2023-12-11 DIAGNOSIS — Z7189 Other specified counseling: Secondary | ICD-10-CM | POA: Diagnosis not present

## 2023-12-11 DIAGNOSIS — Z0000 Encounter for general adult medical examination without abnormal findings: Secondary | ICD-10-CM | POA: Diagnosis not present

## 2023-12-11 DIAGNOSIS — I480 Paroxysmal atrial fibrillation: Secondary | ICD-10-CM | POA: Diagnosis not present

## 2023-12-11 DIAGNOSIS — E1169 Type 2 diabetes mellitus with other specified complication: Secondary | ICD-10-CM | POA: Diagnosis not present

## 2024-01-01 DIAGNOSIS — M542 Cervicalgia: Secondary | ICD-10-CM | POA: Diagnosis not present

## 2024-01-01 DIAGNOSIS — Z299 Encounter for prophylactic measures, unspecified: Secondary | ICD-10-CM | POA: Diagnosis not present

## 2024-01-01 DIAGNOSIS — E1165 Type 2 diabetes mellitus with hyperglycemia: Secondary | ICD-10-CM | POA: Diagnosis not present

## 2024-01-01 DIAGNOSIS — I10 Essential (primary) hypertension: Secondary | ICD-10-CM | POA: Diagnosis not present

## 2024-01-01 DIAGNOSIS — R55 Syncope and collapse: Secondary | ICD-10-CM | POA: Diagnosis not present

## 2024-01-01 DIAGNOSIS — R52 Pain, unspecified: Secondary | ICD-10-CM | POA: Diagnosis not present

## 2024-01-01 DIAGNOSIS — N1831 Chronic kidney disease, stage 3a: Secondary | ICD-10-CM | POA: Diagnosis not present

## 2024-01-03 NOTE — Progress Notes (Signed)
 Cardiology Office Note    Date:  01/05/2024  ID:  GIOIA RANES, DOB [birth year], MRN [id number] PCP:  [facility]  Cardiologist:  Diannah SHAUNNA Maywood, MD  Electrophysiologist:  None   Chief Complaint: Syncope   History of Present Illness: .    JORDANN GRIME is a 83 y.o. female with visit-pertinent history of hypertension, type 2 diabetes mellitus, asthma.  Patient evaluated by Dr. Mallipeddi on 08/10/22 for evaluation of paroxysmal atrial fibrillation.  On review of EKGs performed at PCP it indicated normal sinus rhythm.  Per patient's daughter her Crist mobile identified atrial fibrillation as well as ZIO monitor, unfortunately those strips and ZIO monitor report were not available for review.  Patient denied any palpitations but reported shortness of breath intermittently associated with bilateral lower extremity swelling that had been ongoing for a long time.  Patient also reported vertigo for which she was taking meclizine .  Metoprolol  had previously been decreased from 100 to 50 mg due to low heart rate in the 40s.  Echocardiogram on 09/04/2022 indicated LVEF of greater than 75%, no RWMA, G1 DD, elevated left atrial pressure, RV systolic function and size was normal, mild mitral valve regurgitation with no evidence of stenosis, moderate calcification the aortic valve, aortic valve sclerosis was present without evidence of stenosis.  Reviewed by Dr. Mallipeddi who recommended echo every 3 years for MR surveillance.  Today Ms. Perrault presents with her daughter regarding an episode of syncope that occurred last week.  Patient and her daughter report that she was standing in her kitchen when she suddenly had profound weakness, she noted that she felt lightheaded and felt as though her knees were very weak.  Her daughter assisted her to the bathroom, following using the toilet she stood up and became very pale and sweaty, then per her daughter started to lose  consciousness.  With the assistance of her husband the daughter was able to lay her down on the bathroom floor and called 911.  Per patient's daughter she was able to communicate with her throughout episode although she noted that her speech was very slow and seemed of the edge of consciousness.  Patient returned to her baseline within a few minutes and upon EMS arrival had normal vital signs and EKG, blood sugar was also normal.  Patient declined emergency room evaluation.  Patient's daughter notes that she has been having these episodes of weakness for over a year however patient notes that typically she is able to sit or go lay down for around 20 minutes with resolution of symptoms.  Patient denies any chest pain, shortness of breath or palpitations with episodes, just notes she feels profoundly weak.  Patient notes that she does have a history of vertigo and has problems with intermittent dizziness that is relieved with meclizine .  Patient also endorses intermittent increased lower extremity edema for which she takes Lasix  with some minor improvement some days.  ROS: .   Today she denies chest pain, shortness of breath, fatigue, palpitations, melena, hematuria, hemoptysis, diaphoresis, weakness, orthopnea, and PND.  All other systems are reviewed and otherwise negative. Studies Reviewed: SABRA   EKG:  EKG is ordered today, personally reviewed, demonstrating  EKG Interpretation Date/Time:  Friday January 04 2024 13:28:12 EDT Ventricular Rate:  59 PR Interval:  178 QRS Duration:  82 QT Interval:  428 QTC Calculation: 423 R Axis:   -38  Text Interpretation: Sinus bradycardia Left axis deviation When compared with ECG of 15-Nov-2017 04:00, Vent.  rate has decreased BY  30 BPM T wave amplitude has decreased in Anterolateral leads Confirmed by Sharea Guinther [phone]) on 01/05/2024 7:18:56 PM   CV Studies: Cardiac studies reviewed are outlined and summarized above. Otherwise please see EMR for full report. Cardiac  Studies & Procedures   ______________________________________________________________________________________________     ECHOCARDIOGRAM  ECHOCARDIOGRAM COMPLETE 09/04/2022  Narrative ECHOCARDIOGRAM REPORT    Patient Name:   JERA HEADINGS Date of Exam: 09/04/2022 Medical Rec #:  [id number]          Height:       63.0 in Accession #:    [id number]         Weight:       156.2 lb Date of Birth:  1940/07/20         BSA:          1.741 m Patient Age:    81 years           BP:           138/72 mmHg Patient Gender: F                  HR:           62 bpm. 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 Aortic Valve Disease, Signs/Symptoms:Lower extremity edema; Risk Factors:Hypertension, Diabetes, Dyslipidemia and Non-Smoker.  Sonographer:    Alan Greenhouse RDMS, RVT, RDCS Referring Phys: [id number] VISHNU P MALLIPEDDI  IMPRESSIONS   1. Left ventricular ejection fraction, by estimation, is >75%. The left ventricle has hyperdynamic function. The left ventricle has no regional wall motion abnormalities. Left ventricular diastolic parameters are consistent with Grade I diastolic dysfunction (impaired relaxation). Elevated left atrial pressure. The average left ventricular global longitudinal strain is -25.1 %. The global longitudinal strain is normal. 2. Right ventricular systolic function is normal. The right ventricular size is normal. Tricuspid regurgitation signal is inadequate for assessing PA pressure. 3. The mitral valve is abnormal. Mild mitral valve regurgitation. No evidence of mitral stenosis. 4. The aortic valve is tricuspid. There is moderate calcification of the aortic valve. There is moderate thickening of the aortic valve. Aortic valve regurgitation is not  visualized. Aortic valve sclerosis is present, with no evidence of aortic valve stenosis. 5. The inferior vena cava is normal in size with greater than 50% respiratory variability, suggesting right atrial pressure of 3 mmHg.  Comparison(s): Echocardiogram done 11/16/17 showed an EF of 60-65% with mild MR.  FINDINGS Left Ventricle: Left ventricular ejection fraction, by estimation, is >75%. The left ventricle has hyperdynamic function. The left ventricle has no regional wall motion abnormalities. The average left ventricular global longitudinal strain is -25.1 %. The global longitudinal strain is normal. The left ventricular internal cavity size was normal in size. There is no left ventricular hypertrophy. Left ventricular diastolic parameters are consistent with Grade I diastolic dysfunction (impaired relaxation). Elevated left atrial pressure.  Right Ventricle: The right ventricular size is normal. Right vetricular wall thickness was not well visualized. Right ventricular systolic function is normal. Tricuspid regurgitation signal is inadequate for assessing PA pressure.  Left Atrium: Left atrial size was normal in size.  Right Atrium: Right atrial size was normal in size.  Pericardium: There is no evidence of pericardial effusion.  Mitral Valve:  The mitral valve is abnormal. There is mild thickening of the mitral valve leaflet(s). There is mild calcification of the mitral valve leaflet(s). Mild mitral annular calcification. Mild mitral valve regurgitation. No evidence of mitral valve stenosis.  Tricuspid Valve: The tricuspid valve is normal in structure. Tricuspid valve regurgitation is trivial. No evidence of tricuspid stenosis.  Aortic Valve: The aortic valve is tricuspid. There is moderate calcification of the aortic valve. There is moderate thickening of the aortic valve. There is moderate aortic valve annular calcification. Aortic valve regurgitation is not visualized. Aortic valve  sclerosis is present, with no evidence of aortic valve stenosis. Aortic valve mean gradient measures 7.8 mmHg. Aortic valve peak gradient measures 11.2 mmHg. Aortic valve area, by VTI measures 2.18 cm.  Pulmonic Valve: The pulmonic valve was not well visualized. Pulmonic valve regurgitation is mild. No evidence of pulmonic stenosis.  Aorta: The aortic root is normal in size and structure.  Venous: The inferior vena cava is normal in size with greater than 50% respiratory variability, suggesting right atrial pressure of 3 mmHg.  IAS/Shunts: No atrial level shunt detected by color flow Doppler.   LEFT VENTRICLE PLAX 2D LVIDd:         3.50 cm     Diastology LVIDs:         1.50 cm     LV e' medial:    6.42 cm/s LV PW:         1.10 cm     LV E/e' medial:  18.1 LV IVS:        0.90 cm     LV e' lateral:   5.55 cm/s LVOT diam:     1.80 cm     LV E/e' lateral: 20.9 LV SV:         92 LV SV Index:   53          2D Longitudinal Strain LVOT Area:     2.54 cm    2D Strain GLS (A2C):   -24.1 % 2D Strain GLS (A3C):   -26.6 % 2D Strain GLS (A4C):   -24.7 % LV Volumes (MOD)           2D Strain GLS Avg:     -25.1 % LV vol d, MOD A2C: 35.4 ml LV vol d, MOD A4C: 47.4 ml LV vol s, MOD A2C: 12.3 ml LV vol s, MOD A4C: 16.6 ml LV SV MOD A2C:     23.1 ml LV SV MOD A4C:     47.4 ml LV SV MOD BP:      27.4 ml  RIGHT VENTRICLE RV S prime:     11.60 cm/s TAPSE (M-mode): 2.1 cm  LEFT ATRIUM           Index        RIGHT ATRIUM           Index LA diam:      3.40 cm 1.95 cm/m   RA Area:     10.30 cm LA Vol (A2C): 21.9 ml 12.58 ml/m  RA Volume:   20.00 ml  11.49 ml/m LA Vol (A4C): 38.1 ml 21.89 ml/m AORTIC VALVE                     PULMONIC VALVE AV Area (Vmax):    2.19 cm      PR End Diast Vel: 3.61 msec AV Area (Vmean):   2.01 cm AV Area (VTI):     2.18 cm AV Vmax:  167.50 cm/s AV Vmean:          132.816 cm/s AV VTI:            0.421 m AV Peak Grad:      11.2 mmHg AV Mean Grad:       7.8 mmHg LVOT Vmax:         144.00 cm/s LVOT Vmean:        105.000 cm/s LVOT VTI:          0.361 m LVOT/AV VTI ratio: 0.86  AORTA Ao Root diam: 3.30 cm  MITRAL VALVE MV Area (PHT): 3.16 cm     SHUNTS MV Decel Time: 240 msec     Systemic VTI:  0.36 m MR Peak grad: 98.9 mmHg     Systemic Diam: 1.80 cm MR Vmax:      497.33 cm/s MV E velocity: 116.00 cm/s MV A velocity: 127.00 cm/s MV E/A ratio:  0.91  Dorn Ross MD Electronically signed by Dorn Ross MD Signature Date/Time: 09/05/2022/9:05:49 AM    Final          ______________________________________________________________________________________________       Current Reported Medications:.    Current Meds  Medication Sig   Calcium Carb-Cholecalciferol (CALCIUM 1000 + D PO) Take by mouth.   cetirizine (ZYRTEC) 10 MG tablet Take 10 mg by mouth daily.   diazepam  (VALIUM ) 5 MG tablet Take 5 mg by mouth 3 (three) times daily as needed (for dizziness if meclizine  does not provide relief).    furosemide  (LASIX ) 20 MG tablet Take 20 mg by mouth daily.   gabapentin  (NEURONTIN ) 100 MG capsule Take 100-200 mg by mouth See admin instructions. Take one capsule (100mg ) in the morning and at midday, then take two capsules (200mg ) at bedtime.   irbesartan (AVAPRO) 300 MG tablet Take 300 mg by mouth daily.   meclizine  (ANTIVERT ) 25 MG tablet Take 25 mg by mouth 3 (three) times daily as needed for dizziness.   metFORMIN (GLUCOPHAGE) 500 MG tablet Take 500 mg by mouth daily with breakfast.   metoprolol  succinate (TOPROL -XL) 100 MG 24 hr tablet Take 1 tablet (100 mg total) by mouth daily. Take with or immediately following a meal. (Patient taking differently: Take 50 mg by mouth daily. Take with or immediately following a meal.)   montelukast  (SINGULAIR ) 10 MG tablet Take 10 mg by mouth daily.   potassium chloride SA (K-DUR,KLOR-CON) 20 MEQ tablet Take 20 mEq by mouth 2 (two) times daily.   predniSONE  (DELTASONE ) 5 MG tablet  Take 5 mg by mouth daily.   rOPINIRole (REQUIP) 1 MG tablet Take 1 mg by mouth in the morning and at bedtime.   tiotropium (SPIRIVA ) 18 MCG inhalation capsule Place 18 mcg into inhaler and inhale daily as needed (wheezing or shortness of breath).    Physical Exam:    VS:  BP (!) 154/82   Pulse 61   Resp 16   Ht 5' 3 (1.6 m)   Wt 153 lb 12.8 oz (69.8 kg)   BMI 27.24 kg/m    Wt Readings from Last 3 Encounters:  01/04/24 153 lb 12.8 oz (69.8 kg)  08/10/22 156 lb 3.2 oz (70.9 kg)  11/15/17 152 lb 1.9 oz (69 kg)    GEN: Well nourished, well developed in no acute distress NECK: No JVD; No carotid bruits CARDIAC: RRR, 2/6 systolic murmur, no rubs, gallops RESPIRATORY:  Clear to auscultation without rales, wheezing or rhonchi  ABDOMEN: Soft, non-tender, non-distended EXTREMITIES:  No edema;  No acute deformity     Asessement and Plan:SABRA    Syncope: Patient reports an episode of syncope last week, notes that she was standing in her kitchen when she had profound weakness, her daughter who assisted her to the bathroom and after using the toilet she stood up and became very pale and sweaty.  Her daughter reports that she started to lose consciousness however had verbal responses to her daughter although notes they were very slow however made sense.  Patient notes that her vision did go black and she felt as if she was unconscious, resolved after a few minutes.  EMS was called and by the time they arrived patient was back to her baseline and reportedly had normal vitals and EKG, blood sugar was also normal.  Patient was not evaluated in the ED however did present to her PCP for follow-up.  Patient reports that she has had episodes of weakness for over a year, she is able to lay down for around 20 minutes with resolution of symptoms.  She denies any chest pain or shortness of breath with these episodes, also denies any palpitations.  Reviewed ED precautions with patient.  She is agreeable to wearing a  30-day monitor, echocardiogram and carotid Dopplers.  Will also check CBC, CMET, TSH and magnesium  level today.  I did discuss the [place] DMV medical guidelines for driving: it is prudent to recommend that all persons should be free of syncopal episodes for at least six months to be granted the driving privilege. (THE [employer]  PHYSICIAN'S GUIDE TO DRIVER MEDICAL EVALUATION, Second Edition, Medical Review Branch, Associate Professor, Division of [employer],   Department of Transportation, July 2004)   PAF: Patient reports history of PAF however reports her PCP felt that she did not have frequently enough to start on anticoagulation. Patient to wear 30 days cardiac monitor, will need to consider anticoagulation pending results.   HTN: Blood pressure today initially elevated at 188/77, patient report she was very anxious for her appointment today. At end of visit was 156/82.  She denies headache, vision changes, blurry vision. Patient and her daughter report that her blood pressure at home has recently been very well-controlled, encouraged patient to monitor at home and notify the office if consistently elevated above 140/90, will allow for some permissive hypertension given recent syncope.  Patient was not orthostatic at office visit today.  Continue irbesartan 300 mg daily, metoprolol  succinate 100 mg daily.  MR/Cardiac murmur: Noted to be mild on echo in 08/2022.  On exam today patient did have 2/6 systolic murmur.  Patient to have repeat echocardiogram in setting of syncope, will allow for further evaluation.  Sleep disordered breathing/snoring: Patient's daughter reports that she has seen her on numerous occasions stop breathing during sleep and has a significant degree of snoring.  Patient agreeable to a split-night sleep study.  STOP-BANG 5.    Disposition: F/u with Dr. Mallipeddi in 2 months or sooner if needed.   Signed, Amman Bartel D Laverda Stribling, NP

## 2024-01-04 ENCOUNTER — Ambulatory Visit: Attending: Cardiology | Admitting: Cardiology

## 2024-01-04 VITALS — BP 188/82 | HR 61 | Resp 16 | Ht 63.0 in | Wt 153.8 lb

## 2024-01-04 DIAGNOSIS — I10 Essential (primary) hypertension: Secondary | ICD-10-CM

## 2024-01-04 DIAGNOSIS — R55 Syncope and collapse: Secondary | ICD-10-CM

## 2024-01-04 DIAGNOSIS — G4730 Sleep apnea, unspecified: Secondary | ICD-10-CM | POA: Diagnosis not present

## 2024-01-04 DIAGNOSIS — I340 Nonrheumatic mitral (valve) insufficiency: Secondary | ICD-10-CM

## 2024-01-04 DIAGNOSIS — R0683 Snoring: Secondary | ICD-10-CM

## 2024-01-04 NOTE — Patient Instructions (Signed)
 Medication Instructions:  No changes *If you need a refill on your cardiac medications before your next appointment, please call your pharmacy*  Lab Work: We are going to have a CBC, Cmet, TSH, and Mag level If you have labs (blood work) drawn today and your tests are completely normal, you will receive your results only by: MyChart Message (if you have MyChart) OR A paper copy in the mail If you have any lab test that is abnormal or we need to change your treatment, we will call you to review the results.  Testing/Procedures: The team over at [facility] will reach out to you to scheduled for the split night sleep study.  Your physician has requested that you have an echocardiogram. Echocardiography is a painless test that uses sound waves to create images of your heart. It provides your doctor with information about the size and shape of your heart and how well your heart's chambers and valves are working. This procedure takes approximately one hour. There are no restrictions for this procedure. Please do NOT wear cologne, perfume, aftershave, or lotions (deodorant is allowed). Please arrive 15 minutes prior to your appointment time.  Please note: We ask at that you not bring children with you during ultrasound (echo/ vascular) testing. Due to room size and safety concerns, children are not allowed in the ultrasound rooms during exams. Our front office staff cannot provide observation of children in our lobby area while testing is being conducted. An adult accompanying a patient to their appointment will only be allowed in the ultrasound room at the discretion of the ultrasound technician under special circumstances. We apologize for any inconvenience.  Your physician has requested that you have a carotid duplex. This test is an ultrasound of the carotid arteries in your neck. It looks at blood flow through these arteries that supply the brain with blood. Allow one hour for this exam. There are  no restrictions or special instructions. This will take place at [address] 4th floor  Please note: We ask at that you not bring children with you during ultrasound (echo/ vascular) testing. Due to room size and safety concerns, children are not allowed in the ultrasound rooms during exams. Our front office staff cannot provide observation of children in our lobby area while testing is being conducted. An adult accompanying a patient to their appointment will only be allowed in the ultrasound room at the discretion of the ultrasound technician under special circumstances. We apologize for any inconvenience. Follow-Up: At [facility], you and your health needs are our priority.  As part of our continuing mission to provide you with exceptional heart care, our providers are all part of one team.  This team includes your primary Cardiologist (physician) and Advanced Practice Providers or APPs (Physician Assistants and Nurse Practitioners) who all work together to provide you with the care you need, when you need it.  Your next appointment:   2-3 month(s)  Provider:   Vishnu P Mallipeddi, MD    We recommend signing up for the patient portal called MyChart.  Sign up information is provided on this After Visit Summary.  MyChart is used to connect with patients for Virtual Visits (Telemedicine).  Patients are able to view lab/test results, encounter notes, upcoming appointments, etc.  Non-urgent messages can be sent to your provider as well.   To learn more about what you can do with MyChart, go to [email or web address].   Other Instructions [employer] Cardiac Event Monitor Instructions  Your  physician has requested you wear your cardiac event monitor for 30 days. [employer] may call or text to confirm a shipping address. The monitor will be sent to a land address via UPS. [employer] will not ship a monitor to a PO BOX. It typically takes 3-5 days to receive your monitor after it  has been enrolled. [employer] will assist with USPS tracking if your package is delayed. The telephone number for [employer] is [phone]. Once you have received your monitor, please review the enclosed instructions. Instruction tutorials can also be viewed under help and settings on the enclosed cell phone. Your monitor has already been registered assigning a specific monitor serial # to you.  Billing and Self Pay Discount Information  [employer] has been provided the insurance information we had on file for you.  If your insurance has been updated, please call [employer] at [phone] to provide them with your updated insurance information.   [employer] offers a discounted Self Pay option for patients who have insurance that does not cover their cardiac event monitor or patients without insurance.  The discounted cost of a Self Pay Cardiac Event Monitor would be $225.00 , if the patient contacts [employer] at [phone] within 7 days of applying the monitor to make payment arrangements.  If the patient does not contact [employer] within 7 days of applying the monitor, the cost of the cardiac event monitor will be $350.00.  Applying the monitor  Remove cell phone from case and turn it on. The cell phone works as IT consultant and needs to be within [employer] of you at all times. The cell phone will need to be charged on a daily basis. We recommend you plug the cell phone into the enclosed charger at your bedside table every night.  Monitor batteries: You will receive two monitor batteries labelled #1 and #2. These are your recorders. Plug battery #2 onto the second connection on the enclosed charger. Keep one battery on the charger at all times. This will keep the monitor battery deactivated. It will also keep it fully charged for when you need to switch your monitor batteries. A small light will be blinking on the battery emblem when it is charging. The light on the battery emblem  will remain on when the battery is fully charged.  Open package of a Monitor strip. Insert battery #1 into black hood on strip and gently squeeze monitor battery onto connection as indicated in instruction booklet. Set aside while preparing skin.  Choose location for your strip, vertical or horizontal, as indicated in the instruction booklet. Shave to remove all hair from location. There cannot be any lotions, oils, powders, or colognes on skin where monitor is to be applied. Wipe skin clean with enclosed Saline wipe. Dry skin completely.  Peel paper labeled #1 off the back of the Monitor strip exposing the adhesive. Place the monitor on the chest in the vertical or horizontal position shown in the instruction booklet. One arrow on the monitor strip must be pointing upward. Carefully remove paper labeled #2, attaching remainder of strip to your skin. Try not to create any folds or wrinkles in the strip as you apply it.  Firmly press and release the circle in the center of the monitor battery. You will hear a small beep. This is turning the monitor battery on. The heart emblem on the monitor battery will light up every 5 seconds if the monitor battery in turned on and connected to the patient securely. Do not push and  hold the circle down as this turns the monitor battery off. The cell phone will locate the monitor battery. A screen will appear on the cell phone checking the connection of your monitor strip. This may read poor connection initially but change to good connection within the next minute. Once your monitor accepts the connection you will hear a series of 3 beeps followed by a climbing crescendo of beeps. A screen will appear on the cell phone showing the two monitor strip placement options. Touch the picture that demonstrates where you applied the monitor strip.  Your monitor strip and battery are waterproof. You are able to shower, bathe, or swim with the monitor on. They just ask  you do not submerge deeper than 3 feet underwater. We recommend removing the monitor if you are swimming in a lake, river, or ocean.  Your monitor battery will need to be switched to a fully charged monitor battery approximately once a week. The cell phone will alert you of an action which needs to be made.  On the cell phone, tap for details to reveal connection status, monitor battery status, and cell phone battery status. The green dots indicates your monitor is in good status. A red dot indicates there is something that needs your attention.  To record a symptom, click the circle on the monitor battery. In 30-60 seconds a list of symptoms will appear on the cell phone. Select your symptom and tap save. Your monitor will record a sustained or significant arrhythmia regardless of you clicking the button. Some patients do not feel the heart rhythm irregularities. [employer] will notify us  of any serious or critical events.  Refer to instruction booklet for instructions on switching batteries, changing strips, the Do not disturb or Pause features, or any additional questions.  Call [employer] at [phone], to confirm your monitor is transmitting and record your baseline. They will answer any questions you may have regarding the monitor instructions at that time.  Returning the monitor to [employer]  Place all equipment back into blue box. Peel off strip of paper to expose adhesive and close box securely. There is a prepaid UPS shipping label on this box. Drop in a UPS drop box, or at a UPS facility like [employer]. You may also contact [employer] to arrange UPS to pick up monitor package at your home.

## 2024-01-05 ENCOUNTER — Encounter: Payer: Self-pay | Admitting: Cardiology

## 2024-01-05 LAB — TSH: TSH: 1.13 u[IU]/mL (ref 0.450–4.500)

## 2024-01-05 LAB — CBC
Hematocrit: 36.3 % (ref 34.0–46.6)
Hemoglobin: 11.4 g/dL (ref 11.1–15.9)
MCH: 28.6 pg (ref 26.6–33.0)
MCHC: 31.4 g/dL — ABNORMAL LOW (ref 31.5–35.7)
MCV: 91 fL (ref 79–97)
Platelets: 166 10*3/uL (ref 150–450)
RBC: 3.98 x10E6/uL (ref 3.77–5.28)
RDW: 12.4 % (ref 11.7–15.4)
WBC: 8.7 10*3/uL (ref 3.4–10.8)

## 2024-01-05 LAB — COMPREHENSIVE METABOLIC PANEL WITH GFR
ALT: 27 IU/L (ref 0–32)
AST: 23 IU/L (ref 0–40)
Albumin: 4 g/dL (ref 3.7–4.7)
Alkaline Phosphatase: 58 IU/L (ref 44–121)
BUN/Creatinine Ratio: 17 (ref 12–28)
BUN: 16 mg/dL (ref 8–27)
Bilirubin Total: 1.3 mg/dL — ABNORMAL HIGH (ref 0.0–1.2)
CO2: 24 mmol/L (ref 20–29)
Calcium: 9.5 mg/dL (ref 8.7–10.3)
Chloride: 105 mmol/L (ref 96–106)
Creatinine, Ser: 0.93 mg/dL (ref 0.57–1.00)
Globulin, Total: 2 g/dL (ref 1.5–4.5)
Glucose: 121 mg/dL — ABNORMAL HIGH (ref 70–99)
Potassium: 3.8 mmol/L (ref 3.5–5.2)
Sodium: 140 mmol/L (ref 134–144)
Total Protein: 6 g/dL (ref 6.0–8.5)
eGFR: 61 mL/min/{1.73_m2} (ref >59)

## 2024-01-05 LAB — MAGNESIUM: Magnesium: 1.5 mg/dL — ABNORMAL LOW (ref 1.6–2.3)

## 2024-01-07 ENCOUNTER — Ambulatory Visit: Payer: Self-pay | Admitting: Cardiology

## 2024-01-07 DIAGNOSIS — R55 Syncope and collapse: Secondary | ICD-10-CM

## 2024-01-07 DIAGNOSIS — I340 Nonrheumatic mitral (valve) insufficiency: Secondary | ICD-10-CM

## 2024-01-07 DIAGNOSIS — I10 Essential (primary) hypertension: Secondary | ICD-10-CM

## 2024-01-07 NOTE — Telephone Encounter (Signed)
 Called patient advised of below they verbalized understanding.

## 2024-01-07 NOTE — Telephone Encounter (Signed)
-----   Message from Katlyn D West sent at 01/07/2024 11:58 AM EDT ----- Please let Emily Bryan know that her CBC showed no evidence of anemia or infection. Her kidney, thyroid  and liver function were normal. Her electrolytes were overall normal aside from her magnesium   level being low, at office visit she reported she had not been taking her magnesium  supplement, please have her resume this. Please recheck BMET and magnesium  level in 2-3 weeks.  ----- Message ----- From: Rebecka Memos Lab Results In Sent: 01/04/2024  11:41 PM EDT To: Katlyn D West, NP

## 2024-01-08 ENCOUNTER — Telehealth: Payer: Self-pay | Admitting: Cardiology

## 2024-01-08 NOTE — Telephone Encounter (Signed)
 Pt wants to have Echo and Carotid done at [facility]. Please fax orders to [phone] ASAP

## 2024-01-09 ENCOUNTER — Ambulatory Visit: Admitting: Internal Medicine

## 2024-01-11 DIAGNOSIS — M4802 Spinal stenosis, cervical region: Secondary | ICD-10-CM | POA: Diagnosis not present

## 2024-01-11 DIAGNOSIS — G9589 Other specified diseases of spinal cord: Secondary | ICD-10-CM | POA: Diagnosis not present

## 2024-01-11 DIAGNOSIS — M5021 Other cervical disc displacement,  high cervical region: Secondary | ICD-10-CM | POA: Diagnosis not present

## 2024-01-11 DIAGNOSIS — R55 Syncope and collapse: Secondary | ICD-10-CM | POA: Diagnosis not present

## 2024-02-14 ENCOUNTER — Ambulatory Visit: Attending: Cardiology

## 2024-02-14 DIAGNOSIS — R55 Syncope and collapse: Secondary | ICD-10-CM

## 2024-02-21 ENCOUNTER — Ambulatory Visit: Admitting: Internal Medicine

## 2024-03-27 DIAGNOSIS — R55 Syncope and collapse: Secondary | ICD-10-CM | POA: Diagnosis not present

## 2024-03-28 NOTE — Progress Notes (Signed)
 Patient given results. Will continue medication as prescribed and come to office visit with Mallipeddi

## 2024-04-08 ENCOUNTER — Ambulatory Visit: Attending: Internal Medicine | Admitting: Internal Medicine

## 2024-04-08 ENCOUNTER — Encounter: Payer: Self-pay | Admitting: Internal Medicine

## 2024-04-08 ENCOUNTER — Telehealth: Payer: Self-pay

## 2024-04-08 VITALS — BP 136/70 | HR 64 | Ht 63.0 in | Wt 150.8 lb

## 2024-04-08 DIAGNOSIS — Z01810 Encounter for preprocedural cardiovascular examination: Secondary | ICD-10-CM | POA: Diagnosis not present

## 2024-04-08 DIAGNOSIS — R0789 Other chest pain: Secondary | ICD-10-CM | POA: Insufficient documentation

## 2024-04-08 DIAGNOSIS — R079 Chest pain, unspecified: Secondary | ICD-10-CM | POA: Diagnosis not present

## 2024-04-08 DIAGNOSIS — R0609 Other forms of dyspnea: Secondary | ICD-10-CM | POA: Diagnosis not present

## 2024-04-08 DIAGNOSIS — R0602 Shortness of breath: Secondary | ICD-10-CM

## 2024-04-08 NOTE — Patient Instructions (Addendum)
 Medication Instructions:  Your physician recommends that you continue on your current medications as directed. Please refer to the Current Medication list given to you today.   Labwork: BNP to be complete at [facility] today  Testing/Procedures:   Your cardiac CT will be scheduled at one of the below locations:   [facility] [address] [place], [place] [address] [phone] (Severe contrast allergies only)  OR   [facility] [address]  [place], [place] [address] [phone]  If scheduled at [facility], please arrive at the [facility] Entrance (Entrance C2) of [facility] 30 minutes prior to test start time. You can use the FREE valet parking offered at entrance C (encouraged to control the heart rate for the test)  Proceed to the [facility] Radiology Department (first floor) to check-in and test prep.  All radiology patients and guests should use entrance C2 at [facility], accessed from [facility], even though the hospital's physical address listed is [address]  If scheduled at the Heart and Vascular Tower at [address], please enter the parking lot using the [address] entrance and use the FREE valet service at the patient drop-off area. Enter the building and check-in with registration on the main floor.   Please follow these instructions carefully (unless otherwise directed):  An IV will be required for this test and Nitroglycerin will be given.   On the Night Before the Test: Be sure to Drink plenty of water. Do not consume any caffeinated/decaffeinated beverages or chocolate 12 hours prior to your test. Do not take any antihistamines 12 hours prior to your test.  If the patient has contrast allergy: No Allergy  On the Day of the Test: Drink plenty of water until 1 hour prior to the test. Do not eat any food 1 hour prior to  test. You may take your regular medications prior to the test.  Take Metoprolol  Succinate 100 mg two hours prior to test. If you take Furosemide /Hydrochlorothiazide /Spironolactone/Chlorthalidone, Potassium Chloride please HOLD on the morning of the test. Patients who wear a continuous glucose monitor MUST remove the device prior to scanning. FEMALES- please wear underwire-free bra if available, avoid dresses & tight clothing      After the Test: Drink plenty of water. After receiving IV contrast, you may experience a mild flushed feeling. This is normal. On occasion, you may experience a mild rash up to 24 hours after the test. This is not dangerous. If this occurs, you can take Benadryl 25 mg, Zyrtec, Claritin , or Allegra and increase your fluid intake. (Patients taking Tikosyn should avoid Benadryl, and may take Zyrtec, Claritin , or Allegra) If you experience trouble breathing, this can be serious. If it is severe call 911 IMMEDIATELY. If it is mild, please call our office.  We will call to schedule your test 2-4 weeks out understanding that some insurance companies will need an authorization prior to the service being performed.   For more information and frequently asked questions, please visit our website : [email or web address]  For non-scheduling related questions, please contact the cardiac imaging nurse navigator should you have any questions/concerns: Cardiac Imaging Nurse Navigators Direct Office Dial: [phone]   For scheduling needs, including cancellations and rescheduling, please call [place], [phone].   Follow-Up: Your physician recommends that you schedule a follow-up appointment in: 6 months   Any Other Special Instructions Will Be Listed Below (If Applicable).  Dr.  Mallipeddi recommends Special educational needs teacher with EKG   Thank you for choosing [facility]!     If you need a refill on your cardiac medications before your next  appointment, please call your pharmacy.

## 2024-04-08 NOTE — Telephone Encounter (Signed)
 Daughter had asked if her mother needed to have the test completed that was ordered by Katlyn West, NP. Advised her that Dr. Mallipeddi stated that she could have the Echo done, really didn't need the Carotid. Daughter stated that wouldn't be a bad idea to have it done. She stated she will see if she received the order at [facility] and would have them completed. No further issues

## 2024-04-08 NOTE — Progress Notes (Signed)
 Cardiology Office Note  Date: 04/08/2024   ID: Emily Bryan, DOB [birth year], MRN [id number]  PCP:  [facility]  Cardiologist:  Diannah SHAUNNA Maywood, MD Electrophysiologist:  None   Reason for Office Visit: Paroxysmal Afib evaluation  History of Present Illness: Emily Bryan is a 83 y.o. female known to have HTN, DM 2, asthma   Patient was initially referred to cardiology clinic for evaluation of paroxysmal atrial fibrillation.  Patient's daughter noticed atrial fibrillation on the [employer] mobile.  She underwent event monitor at the PCPs office, records not available. 30-day event monitor in July 25 did not reveal any atrial fibrillation or atrial flutter.  She had 1 episode of syncope a few months ago that prompted her ER visit and eventual cardiology clinic appointment in [place], echo, event monitor and carotid Doppler ultrasound were ordered.  She usually has paroxysms of fatigue spells once a month and sometimes couple of times in a few months.  Sporadic in nature.  During the sweaty spells, she feels extremely dizzy.  Did not check blood pressure at this time.  She also reported having dyspnea on exertion is getting worse recently and she also complained of chest discomfort associated with it.   Past Medical History:  Diagnosis Date   Asthma    Diabetes mellitus without complication (HCC)    Essential hypertension    Vertigo     Past Surgical History:  Procedure Laterality Date   CHOLECYSTECTOMY     HIP PINNING,CANNULATED Right 11/15/2017   Procedure: RIGHT HIP PINNING;  Surgeon: Beverley Evalene BIR[facility], MD;  Location: [facility] OR;  Service: Orthopedics;  Laterality: Right;    Current Outpatient Medications  Medication Sig Dispense Refill   Calcium Carb-Cholecalciferol (CALCIUM 1000 + D PO) Take by mouth.     cetirizine (ZYRTEC) 10 MG tablet Take 10 mg by mouth daily.     diazepam  (VALIUM ) 5 MG tablet Take 5 mg by mouth 3 (three) times daily as needed (for  dizziness if meclizine  does not provide relief).   0   furosemide  (LASIX ) 20 MG tablet Take 20 mg by mouth daily.     gabapentin  (NEURONTIN ) 100 MG capsule Take 100-200 mg by mouth See admin instructions. Take one capsule (100mg ) in the morning and at midday, then take two capsules (200mg ) at bedtime.  5   irbesartan (AVAPRO) 300 MG tablet Take 300 mg by mouth daily.     meclizine  (ANTIVERT ) 25 MG tablet Take 25 mg by mouth 3 (three) times daily as needed for dizziness.  0   metFORMIN (GLUCOPHAGE) 500 MG tablet Take 500 mg by mouth daily with breakfast.     metoprolol  succinate (TOPROL -XL) 100 MG 24 hr tablet Take 1 tablet (100 mg total) by mouth daily. Take with or immediately following a meal. (Patient taking differently: Take 50 mg by mouth daily. Take with or immediately following a meal.) 30 tablet 0   montelukast  (SINGULAIR ) 10 MG tablet Take 10 mg by mouth daily.  0   potassium chloride SA (K-DUR,KLOR-CON) 20 MEQ tablet Take 20 mEq by mouth 2 (two) times daily.     predniSONE  (DELTASONE ) 5 MG tablet Take 5 mg by mouth daily.  0   rOPINIRole (REQUIP) 1 MG tablet Take 1 mg by mouth in the morning and at bedtime.     tiotropium (SPIRIVA ) 18 MCG inhalation capsule Place 18 mcg into inhaler and inhale daily as needed (wheezing or shortness of breath).     No current  facility-administered medications for this visit.   Allergies:  Asa [aspirin], Erythromycin, and Sulfa antibiotics   Social History: The patient  reports that she has never smoked. She has never used smokeless tobacco. She reports that she does not drink alcohol  and does not use drugs.   Family History: The patient's family history includes Hypertension in her brother.   ROS:  Please see the history of present illness. Otherwise, complete review of systems is positive for none.  All other systems are reviewed and negative.   Physical Exam: VS:  BP 136/70   Pulse 64   Ht 5' 3 (1.6 m)   Wt 150 lb 12.8 oz (68.4 kg)   SpO2 97%    BMI 26.71 kg/m , BMI Body mass index is 26.71 kg/m.  Wt Readings from Last 3 Encounters:  04/08/24 150 lb 12.8 oz (68.4 kg)  01/04/24 153 lb 12.8 oz (69.8 kg)  08/10/22 156 lb 3.2 oz (70.9 kg)    General: Patient appears comfortable at rest. HEENT: Conjunctiva and lids normal, oropharynx clear with moist mucosa. Neck: Supple, no elevated JVP or carotid bruits, no thyromegaly. Lungs: Clear to auscultation, nonlabored breathing at rest. Cardiac: Regular rate and rhythm, no S3 or significant systolic murmur, no pericardial rub. Abdomen: Soft, nontender, no hepatomegaly, bowel sounds present, no guarding or rebound. Extremities: 2-3+ pitting edema in bilateral lower extremities, distal pulses 2+. Skin: Warm and dry. Musculoskeletal: No kyphosis. Neuropsychiatric: Alert and oriented x3, affect grossly appropriate.  ECG:  An ECG dated 08/10/2022 was personally reviewed today and demonstrated:  Normal sinus rhythm  Recent Labwork: 01/04/2024: ALT 27; AST 23; BUN 16; Creatinine, Ser 0.93; Hemoglobin 11.4; Magnesium  1.5; Platelets 166; Potassium 3.8; Sodium 140; TSH 1.130  No results found for: CHOL, TRIG, HDL, CHOLHDL, VLDL, LDLCALC, LDLDIRECT  Other Studies Reviewed Today: I personally reviewed the heart care records from the PCPs office  Assessment and Plan:  # Atrial fibrillation on ZIO and Kardia mobile - Zio from PCPs office and Kardia mobile strips not available for me to review.  30-day event monitor in July 2025 were unremarkable.  Continue monitoring with smart watch with EKG lead functionality.  # DOE # Chest discomfort - Worsening recently.  Recent echocardiogram from 2024 within normal limits.  Update echocardiogram. - Obtain BNP and CT cardiac (to rule out any RCA ischemia due to prior history of syncope and paroxysmal fatigue spells during which she is extremely dizzy).  I have spent a total of 30 minutes with patient reviewing chart, EKGs, labs and  examining patient as well as establishing an assessment and plan that was discussed with the patient.  > 50% of time was spent in direct patient care.      Medication Adjustments/Labs and Tests Ordered: Current medicines are reviewed at length with the patient today.  Concerns regarding medicines are outlined above.   Tests Ordered: No orders of the defined types were placed in this encounter.   Medication Changes: No orders of the defined types were placed in this encounter.   Disposition:  Follow up 6 months  Signed Irvin Lizama Priya Taurean Ju, MD, 04/08/2024 11:53 AM    [facility] [address] [place], [place], [place] [address]

## 2024-04-11 DIAGNOSIS — R0602 Shortness of breath: Secondary | ICD-10-CM | POA: Diagnosis not present

## 2024-04-11 DIAGNOSIS — I10 Essential (primary) hypertension: Secondary | ICD-10-CM | POA: Diagnosis not present

## 2024-04-11 DIAGNOSIS — Z299 Encounter for prophylactic measures, unspecified: Secondary | ICD-10-CM | POA: Diagnosis not present

## 2024-04-11 DIAGNOSIS — E119 Type 2 diabetes mellitus without complications: Secondary | ICD-10-CM | POA: Diagnosis not present

## 2024-04-16 ENCOUNTER — Ambulatory Visit: Payer: Self-pay | Admitting: Internal Medicine

## 2024-04-16 DIAGNOSIS — Z79899 Other long term (current) drug therapy: Secondary | ICD-10-CM

## 2024-04-16 DIAGNOSIS — R0609 Other forms of dyspnea: Secondary | ICD-10-CM

## 2024-04-17 DIAGNOSIS — E119 Type 2 diabetes mellitus without complications: Secondary | ICD-10-CM | POA: Diagnosis not present

## 2024-04-17 DIAGNOSIS — I10 Essential (primary) hypertension: Secondary | ICD-10-CM | POA: Diagnosis not present

## 2024-04-17 DIAGNOSIS — R0602 Shortness of breath: Secondary | ICD-10-CM | POA: Diagnosis not present

## 2024-04-17 DIAGNOSIS — R059 Cough, unspecified: Secondary | ICD-10-CM | POA: Diagnosis not present

## 2024-04-17 DIAGNOSIS — T7840XA Allergy, unspecified, initial encounter: Secondary | ICD-10-CM | POA: Diagnosis not present

## 2024-04-17 DIAGNOSIS — T782XXA Anaphylactic shock, unspecified, initial encounter: Secondary | ICD-10-CM | POA: Diagnosis not present

## 2024-04-21 DIAGNOSIS — T7840XA Allergy, unspecified, initial encounter: Secondary | ICD-10-CM | POA: Diagnosis not present

## 2024-04-21 DIAGNOSIS — I10 Essential (primary) hypertension: Secondary | ICD-10-CM | POA: Diagnosis not present

## 2024-04-21 DIAGNOSIS — Z299 Encounter for prophylactic measures, unspecified: Secondary | ICD-10-CM | POA: Diagnosis not present

## 2024-04-21 DIAGNOSIS — N1831 Chronic kidney disease, stage 3a: Secondary | ICD-10-CM | POA: Diagnosis not present

## 2024-04-21 DIAGNOSIS — I480 Paroxysmal atrial fibrillation: Secondary | ICD-10-CM | POA: Diagnosis not present

## 2024-04-21 MED ORDER — FUROSEMIDE 40 MG PO TABS: 40.0000 mg | ORAL_TABLET | Freq: Every day | ORAL | 3 refills | Status: DC

## 2024-04-21 NOTE — Telephone Encounter (Signed)
 The patient has been notified of the result and verbalized understanding.  All questions (if any) were answered. Advised daughter will mail lab order out. Littie CHRISTELLA Croak, CMA 04/21/2024 4:46 PM

## 2024-04-21 NOTE — Telephone Encounter (Signed)
-----   Message from Vishnu P Mallipeddi sent at 04/16/2024 12:59 PM EDT ----- Pro-BNP 1,227. Volume up. Increase lasix  from 20 mg to 40 mg once daily and obtain BMP in 5 days. Schedule follow up in 4 weeks with APP/MD. ----- Message ----- From: Gretel Darryle RAMAN Sent: 04/09/2024   4:44 PM EDT To: Vishnu P Mallipeddi, MD

## 2024-04-30 ENCOUNTER — Telehealth: Payer: Self-pay | Admitting: Internal Medicine

## 2024-04-30 NOTE — Telephone Encounter (Signed)
 BMET Lab order faxed to [facility] [phone].

## 2024-04-30 NOTE — Telephone Encounter (Signed)
 Pts daughter requesting blood work orders be faxed to [facility]. [phone]

## 2024-05-01 ENCOUNTER — Telehealth: Payer: Self-pay | Admitting: Internal Medicine

## 2024-05-01 DIAGNOSIS — R0602 Shortness of breath: Secondary | ICD-10-CM | POA: Diagnosis not present

## 2024-05-01 NOTE — Telephone Encounter (Signed)
 Pts daughter requesting pts lab order for BNP be faxed to  Pts daughter requesting blood work orders be faxed to [facility]. [phone]

## 2024-05-01 NOTE — Telephone Encounter (Signed)
 Spoke with Emily Bryan patient's daughter advised her that ordered for la had already been faxed. And that the order was correct per Dr. Alissa result note sent over and that her kidney levels where being checked as well due to her increase of Lasix . Advised her that will call her with results once they have been reviewed

## 2024-05-05 MED ORDER — FUROSEMIDE 20 MG PO TABS: 20.0000 mg | ORAL_TABLET | Freq: Every day | ORAL | 5 refills | Status: DC

## 2024-05-05 NOTE — Addendum Note (Signed)
 Addended by: JOHNNYE LITTIE HERO on: 05/05/2024 04:54 PM   Modules accepted: Orders

## 2024-05-05 NOTE — Telephone Encounter (Signed)
 The patient has been notified of the result and verbalized understanding.  All questions (if any) were answered. Sent in new script for patient  Emily Bryan, CMA 05/05/2024 4:53 PM

## 2024-05-05 NOTE — Telephone Encounter (Signed)
-----   Message from Vishnu P Mallipeddi sent at 05/05/2024  4:24 PM EDT ----- Creatinine 1.2, slightly up from normal. Decrease lasix  from 40 mg to 20 mg once daily. Provide HF instructions. ----- Message ----- From: Delbra Krebs T Sent: 05/02/2024  10:48 AM EDT To: Vishnu P Mallipeddi, MD

## 2024-05-07 ENCOUNTER — Telehealth: Payer: Self-pay | Admitting: Internal Medicine

## 2024-05-07 DIAGNOSIS — R7989 Other specified abnormal findings of blood chemistry: Secondary | ICD-10-CM

## 2024-05-07 NOTE — Telephone Encounter (Signed)
 Pts daughter would like to confirm its okay for the patient to have a CT scan using contrast.

## 2024-05-08 NOTE — Telephone Encounter (Signed)
 Per Dr. Mallipeddi:  Recent serum creatinine was mildly elevated. Numbers prior to that were within normal limits. Once lasix  dose is reduced, number should be normalized. Okay to get CT cardiac. Repeat BMP prior to CT.  Spoke with patient verbalized understanding and will mail out lab order to have BMET repeated.

## 2024-05-09 ENCOUNTER — Ambulatory Visit (HOSPITAL_COMMUNITY)
Admission: RE | Admit: 2024-05-09 | Discharge: 2024-05-09 | Disposition: A | Discharge status: Home / Self Care | Source: Ambulatory Visit | Attending: Internal Medicine | Admitting: Internal Medicine

## 2024-05-09 DIAGNOSIS — K449 Diaphragmatic hernia without obstruction or gangrene: Secondary | ICD-10-CM | POA: Diagnosis not present

## 2024-05-09 DIAGNOSIS — R931 Abnormal findings on diagnostic imaging of heart and coronary circulation: Secondary | ICD-10-CM | POA: Insufficient documentation

## 2024-05-09 DIAGNOSIS — R079 Chest pain, unspecified: Secondary | ICD-10-CM | POA: Diagnosis not present

## 2024-05-09 DIAGNOSIS — I2510 Atherosclerotic heart disease of native coronary artery without angina pectoris: Secondary | ICD-10-CM | POA: Diagnosis not present

## 2024-05-09 MED ORDER — IOHEXOL 350 MG/ML SOLN
100.0000 mL | Freq: Once | INTRAVENOUS | Status: AC | PRN
  Administered 2024-05-09: 100 mL via INTRAVENOUS

## 2024-05-09 MED ORDER — NITROGLYCERIN 0.4 MG SL SUBL
0.8000 mg | SUBLINGUAL_TABLET | Freq: Once | SUBLINGUAL | Status: AC
  Administered 2024-05-09: 0.8 mg via SUBLINGUAL

## 2024-05-11 ENCOUNTER — Other Ambulatory Visit: Payer: Self-pay | Admitting: Cardiology

## 2024-05-11 ENCOUNTER — Ambulatory Visit (HOSPITAL_COMMUNITY)
Admission: RE | Admit: 2024-05-11 | Discharge: 2024-05-11 | Disposition: A | Discharge status: Home / Self Care | Payer: Self-pay | Source: Ambulatory Visit | Attending: Cardiology | Admitting: Cardiology

## 2024-05-11 DIAGNOSIS — R931 Abnormal findings on diagnostic imaging of heart and coronary circulation: Secondary | ICD-10-CM

## 2024-05-15 DIAGNOSIS — Z299 Encounter for prophylactic measures, unspecified: Secondary | ICD-10-CM | POA: Diagnosis not present

## 2024-05-15 DIAGNOSIS — R5383 Other fatigue: Secondary | ICD-10-CM | POA: Diagnosis not present

## 2024-05-15 DIAGNOSIS — I10 Essential (primary) hypertension: Secondary | ICD-10-CM | POA: Diagnosis not present

## 2024-05-15 DIAGNOSIS — Z0000 Encounter for general adult medical examination without abnormal findings: Secondary | ICD-10-CM | POA: Diagnosis not present

## 2024-05-15 DIAGNOSIS — E7800 Pure hypercholesterolemia, unspecified: Secondary | ICD-10-CM | POA: Diagnosis not present

## 2024-05-15 DIAGNOSIS — R52 Pain, unspecified: Secondary | ICD-10-CM | POA: Diagnosis not present

## 2024-05-15 DIAGNOSIS — R609 Edema, unspecified: Secondary | ICD-10-CM | POA: Diagnosis not present

## 2024-05-15 DIAGNOSIS — Z79899 Other long term (current) drug therapy: Secondary | ICD-10-CM | POA: Diagnosis not present

## 2024-05-19 NOTE — Telephone Encounter (Signed)
-----   Message from Vishnu P Mallipeddi sent at 05/15/2024  5:44 PM EDT ----- Coronary calcium score is 506, 76 th percentile for her age and sex matched control. Mild to moderate CAD is present however, significant stenosis of distal LM cannot be completely excluded per  report. FFR did not reveal any significant stenosis. Has appt with Almarie on 05/27/24. Need to assess symptoms and set her up for [facility] and [facility]. ----- Message ----- From: Interface, Rad Results In Sent: 05/11/2024  10:59 PM EDT To: Vishnu P Mallipeddi, MD

## 2024-05-19 NOTE — Telephone Encounter (Signed)
 The patient has been notified of the result and verbalized understanding.  All questions (if any) were answered. Emily Bryan, CMA 05/19/2024 8:45 AM

## 2024-05-27 ENCOUNTER — Ambulatory Visit: Attending: Nurse Practitioner | Admitting: Nurse Practitioner

## 2024-05-27 ENCOUNTER — Encounter: Payer: Self-pay | Admitting: Nurse Practitioner

## 2024-05-27 VITALS — BP 185/101 | HR 66 | Ht 63.0 in | Wt 146.0 lb

## 2024-05-27 DIAGNOSIS — I509 Heart failure, unspecified: Secondary | ICD-10-CM | POA: Diagnosis not present

## 2024-05-27 DIAGNOSIS — Z79899 Other long term (current) drug therapy: Secondary | ICD-10-CM

## 2024-05-27 DIAGNOSIS — I2510 Atherosclerotic heart disease of native coronary artery without angina pectoris: Secondary | ICD-10-CM

## 2024-05-27 DIAGNOSIS — I5032 Chronic diastolic (congestive) heart failure: Secondary | ICD-10-CM | POA: Diagnosis not present

## 2024-05-27 DIAGNOSIS — M7989 Other specified soft tissue disorders: Secondary | ICD-10-CM | POA: Diagnosis not present

## 2024-05-27 DIAGNOSIS — I10 Essential (primary) hypertension: Secondary | ICD-10-CM | POA: Diagnosis not present

## 2024-05-27 DIAGNOSIS — R7989 Other specified abnormal findings of blood chemistry: Secondary | ICD-10-CM

## 2024-05-27 DIAGNOSIS — I890 Lymphedema, not elsewhere classified: Secondary | ICD-10-CM

## 2024-05-27 MED ORDER — SACUBITRIL-VALSARTAN 24-26 MG PO TABS: 1.0000 | ORAL_TABLET | Freq: Two times a day (BID) | ORAL | 11 refills | Status: DC

## 2024-05-27 NOTE — Progress Notes (Unsigned)
 Cardiology Office Note   Date:  05/27/2024 ID:  Emily Bryan, DOB Nov 19, 1940, MRN [id number] PCP: Medicine, Emily [facility]  [facility] Providers Bryan:  Emily SHAUNNA Maywood, MD     History of Present Illness Emily Bryan is a 83 y.o. female with a PMH of chest pain, DOE, HTN, A-fib?, who presents today d/t elevated BNP evaluation.   Last seen by Dr. Mallipeddi on 04/08/2024. Pt noted recent worsening chest discomfort and DOE. Echo was updated, however not obtained. Cardiac CT showed coronary calcium score 506 with mild to moderate atherosclerosis/CAD with significant stenosis of distal LM that cannot be completely excluded per her report.  FFR did not reveal any significant stenosis.  BNP was obtained and found to be 1,227.  Patient's Lasix  was increased based on this report and she was instructed to follow-up with MD/APP in 2 to 4 weeks to evaluate symptoms and set up for right/left heart cath.  Today she is here for follow-up.  She states she is doing well. Denies any recent chest pain or shortness of breath.  Admits to chronic leg swelling more noticed along right lower leg than left lower leg.  She continues to have what appears to be lymphedema of the right lower leg, leg swelling has improved with Lasix . Denies any chest pain, shortness of breath, palpitations, syncope, presyncope, dizziness, orthopnea, PND, acute bleeding, or claudication.  Weight is down 4 pounds from last office visit.   ROS: Negative.  See HPI.  Studies Reviewed  EKG: EKG is not ordered today.  CCTA 04/2024:  IMPRESSION: 1. Coronary calcium score of 506. This was 76th percentile for age-, sex, and race-matched controls.   2. Normal coronary origin with right dominance.   3. Mild to moderate atherosclerosis: 25-49% but could be >50% in the distal LM and ostial LAD/Ramus/prox OM1.   4. This study has been submitted for FFR analysis.  IMPRESSION: 1. Coronary CTA FFR analysis  demonstrates no hemodynamically flow limiting lesions.  IMPRESSION: 1. No acute abnormality. 2. Small hiatal hernia.   Cardiac monitor 03/2024:    Patch wear time was for 27 days and 21 hours.   Normal sinus rhythm predominantly ranging from 53 to 132 bpm with an average HR 69 bpm.   No evidence of atrial fibrillaiton/flutter, ventricular arrhythmias, high grade AV block and pauses.   <1% PAC burden and <1% PVC burden.   No patient triggered events noted.  Echo 08/2022:   1. Left ventricular ejection fraction, by estimation, is >75%. The left  ventricle has hyperdynamic function. The left ventricle has no regional  wall motion abnormalities. Left ventricular diastolic parameters are  consistent with Grade I diastolic  dysfunction (impaired relaxation). Elevated left atrial pressure. The  average left ventricular global longitudinal strain is -25.1 %. The global  longitudinal strain is normal.   2. Right ventricular systolic function is normal. The right ventricular  size is normal. Tricuspid regurgitation signal is inadequate for assessing  PA pressure.   3. The mitral valve is abnormal. Mild mitral valve regurgitation. No  evidence of mitral stenosis.   4. The aortic valve is tricuspid. There is moderate calcification of the  aortic valve. There is moderate thickening of the aortic valve. Aortic  valve regurgitation is not visualized. Aortic valve sclerosis is present,  with no evidence of aortic valve  stenosis.   5. The inferior vena cava is normal in size with greater than 50%  respiratory variability, suggesting right atrial pressure of 3  mmHg.   Comparison(s): Echocardiogram done 11/16/17 showed an EF of 60-65% with mild  MR.  STOP-Bang Score:  5  {  Physical Exam VS:  BP (!) 185/101 (BP Location: Left Arm)   Pulse 66   Ht 5' 3 (1.6 m)   Wt 146 lb (66.2 kg)   SpO2 100%   BMI 25.86 kg/m   Repeat blood pressure today: 137/70      Wt Readings from Last 3 Encounters:   05/27/24 146 lb (66.2 kg)  04/08/24 150 lb 12.8 oz (68.4 kg)  01/04/24 153 lb 12.8 oz (69.8 kg)    GEN: Well nourished, well developed in no acute distress NECK: No JVD; No carotid bruits CARDIAC: S1/S2, RRR, no murmurs, rubs, gallops RESPIRATORY:  Clear to auscultation without rales, wheezing or rhonchi  ABDOMEN: Soft, non-tender, non-distended EXTREMITIES:  Nonpitting edema to RLE > LLE; No deformity   ASSESSMENT AND PLAN  HFpEF, medication management Newly diagnosed, Stage C, NYHA class I-II symptoms. EF > 75%. Euvolemic and well compensated on exam.  Does have some chronic lymphedema to right lower extremity. Most recent BNP improved from 1, 227 to 994 recently. Will stop Irbesartan and begin Entresto 24/26 mg BID and will continue Lasix , Toprol  XL, and potassium supplement. Plan to adjust GDMT over time.  Will obtain BMET in 1-2 weeks. Low sodium diet, fluid restriction <2L, and daily weights encouraged. Educated to contact our office for weight gain of 2 lbs overnight or 5 lbs in one week.  CAD Most recent coronary CTA showed coronary calcium score 506.  With mild to moderate CAD present, FFR did not reveal any significant stenosis.  See report above. Denies any recent chest pain.  Discussed results of recent test with patient and came to shared medical decision that we will continue to monitor at this time.  No indication for heart cath at this time.  Stopping irbesartan and starting Entresto as noted above.  Will also update Echo at this time. No other medication changes at this time.  Will discuss starting a baby aspirin at next office visit with pt. Heart healthy diet and regular cardiovascular exercise encouraged. Care and ED precautions discussed.   HTN Blood pressure elevated today and improved on recheck.  Will stop irbesartan and begin Entresto as noted above.  No other medication changes at this time.  Discussed to monitor BP at home at least 2 hours after medications and  sitting for 5-10 minutes.  Will bring her back in 2 to 3 weeks for BP check. Heart healthy diet and regular cardiovascular exercise encouraged.   Lymphedema/leg swelling  Leg edema has improved, still notes chronic lymphedema to right lower extremity and around her baseline.  Continue Lasix .  If no improvement by next office visit, consider discussing lymphedema clinic and place referral.  5. Elevated serum creatinine Most recent labs show sCr at 1.14 with eGFR at 48.  Recommended for her to stay well-hydrated.  Will repeat BMET as noted above. Continue to follow with PCP.    Dispo: Follow-up with MD/APP in 6 to 8 weeks or sooner anything changes.  Signed, Almarie Crate, NP

## 2024-05-27 NOTE — Patient Instructions (Signed)
 Medication Instructions:  STOP Irbesartan  START Entresto 24/26 mg twice a day on Thursday morning 05/29/24   Labwork: BMET in 7 days  Testing/Procedures: Your physician has requested that you have an echocardiogram. Echocardiography is a painless test that uses sound waves to create images of your heart. It provides your doctor with information about the size and shape of your heart and how well your heart's chambers and valves are working. This procedure takes approximately one hour. There are no restrictions for this procedure. Please do NOT wear cologne, perfume, aftershave, or lotions (deodorant is allowed). Please arrive 15 minutes prior to your appointment time.  Please note: We ask at that you not bring children with you during ultrasound (echo/ vascular) testing. Due to room size and safety concerns, children are not allowed in the ultrasound rooms during exams. Our front office staff cannot provide observation of children in our lobby area while testing is being conducted. An adult accompanying a patient to their appointment will only be allowed in the ultrasound room at the discretion of the ultrasound technician under special circumstances. We apologize for any inconvenience.   Follow-Up: 6-8 weeks  Any Other Special Instructions Will Be Listed Below (If Applicable).   Keep daily BP log    Nurse visit BP check in 2 weeks  If you need a refill on your cardiac medications before your next appointment, please call your pharmacy.

## 2024-06-04 DIAGNOSIS — H40013 Open angle with borderline findings, low risk, bilateral: Secondary | ICD-10-CM | POA: Diagnosis not present

## 2024-06-04 DIAGNOSIS — Z961 Presence of intraocular lens: Secondary | ICD-10-CM | POA: Diagnosis not present

## 2024-06-04 DIAGNOSIS — H04123 Dry eye syndrome of bilateral lacrimal glands: Secondary | ICD-10-CM | POA: Diagnosis not present

## 2024-06-04 DIAGNOSIS — E119 Type 2 diabetes mellitus without complications: Secondary | ICD-10-CM | POA: Diagnosis not present

## 2024-06-10 ENCOUNTER — Ambulatory Visit: Payer: Self-pay | Admitting: Nurse Practitioner

## 2024-06-11 ENCOUNTER — Ambulatory Visit: Attending: Nurse Practitioner

## 2024-06-11 ENCOUNTER — Encounter: Payer: Self-pay | Admitting: Internal Medicine

## 2024-06-11 ENCOUNTER — Ambulatory Visit (INDEPENDENT_AMBULATORY_CARE_PROVIDER_SITE_OTHER)

## 2024-06-11 DIAGNOSIS — I509 Heart failure, unspecified: Secondary | ICD-10-CM

## 2024-06-11 DIAGNOSIS — I10 Essential (primary) hypertension: Secondary | ICD-10-CM

## 2024-06-11 LAB — ECHOCARDIOGRAM COMPLETE
AR max vel: 2.36 cm2
AV Peak grad: 9.5 mmHg
Ao pk vel: 1.54 m/s
Area-P 1/2: 2.9 cm2
Calc EF: 67 %
S' Lateral: 2 cm
Single Plane A2C EF: 57.5 %
Single Plane A4C EF: 74.8 %

## 2024-06-11 NOTE — Progress Notes (Signed)
 Patient presents today for nurse visit- bp check   Patient stated compliance with stopping irbesartan and starting Entresto .   BP this morning: 173/75 Bp while in office: 146/82   Pt stated that she uses an electric bp cuff (arm) that is 84yo.   Pt brought BP log- will scan into chart.

## 2024-06-16 MED ORDER — SACUBITRIL-VALSARTAN 49-51 MG PO TABS: 1.0000 | ORAL_TABLET | Freq: Two times a day (BID) | ORAL | 11 refills | Status: AC

## 2024-06-16 NOTE — Addendum Note (Signed)
 Addended by: KENETH ROSINA BROCKS on: 06/16/2024 08:21 AM   Modules accepted: Orders

## 2024-06-16 NOTE — Progress Notes (Signed)
 Patient notified and verbalized understanding.

## 2024-07-07 ENCOUNTER — Ambulatory Visit: Attending: Family Medicine | Admitting: *Deleted

## 2024-07-07 VITALS — BP 128/60 | HR 68

## 2024-07-07 DIAGNOSIS — I5032 Chronic diastolic (congestive) heart failure: Secondary | ICD-10-CM

## 2024-07-07 DIAGNOSIS — M7989 Other specified soft tissue disorders: Secondary | ICD-10-CM

## 2024-07-07 DIAGNOSIS — I10 Essential (primary) hypertension: Secondary | ICD-10-CM

## 2024-07-07 DIAGNOSIS — Z79899 Other long term (current) drug therapy: Secondary | ICD-10-CM

## 2024-07-07 NOTE — Progress Notes (Unsigned)
 Patient in office for nurse BP check after increasing dose on her Entresto  to 49-51mg .  128/60  68    Patient does c/o increased swelling in both legs after the increased dose.  She also c/o her neck and face itching after she took the medication but now seems like that is getting better.  She does visibly have marked swelling in both feet, ankles & going up into her legs, worse on the right.  Her weight this a.m at home was 155.4lb.  States that she did take an extra dose of her Lasix  x 3 days but really did not notice it helping any.  Mentions that she has felt more sleepy during the day lately as well.

## 2024-07-08 ENCOUNTER — Encounter: Payer: Self-pay | Admitting: *Deleted

## 2024-07-08 MED ORDER — FUROSEMIDE 40 MG PO TABS: 40.0000 mg | ORAL_TABLET | Freq: Every day | ORAL | 6 refills | Status: AC

## 2024-07-08 NOTE — Progress Notes (Signed)
 Patient notified and verbalized understanding.   Will also send information via mychart - they will try to access it.   Will send new prescription for Lasix  40mg  daily to [facility] now.   Will fax lab order to [facility] - she will do around 07/16/2024.

## 2024-07-11 ENCOUNTER — Ambulatory Visit: Payer: Self-pay | Admitting: Internal Medicine

## 2024-07-11 NOTE — Telephone Encounter (Signed)
 PT Daughter is requesting results via phone. States they can not access My Chart, will attempt to help reset My Chart .

## 2024-10-10 ENCOUNTER — Ambulatory Visit: Admitting: Internal Medicine
